# Patient Record
Sex: Male | Born: 1945 | Race: White | Hispanic: No | Marital: Single | Smoking: Never smoker
Health system: Southern US, Community
[De-identification: ages and names within clinical notes are randomized; demographics above are authoritative.]

## PROBLEM LIST (undated history)

## (undated) DIAGNOSIS — I10 Essential (primary) hypertension: Secondary | ICD-10-CM

## (undated) DIAGNOSIS — H409 Unspecified glaucoma: Secondary | ICD-10-CM

## (undated) DIAGNOSIS — E785 Hyperlipidemia, unspecified: Secondary | ICD-10-CM

## (undated) HISTORY — DX: Essential (primary) hypertension: I10

## (undated) HISTORY — PX: OTHER SURGICAL HISTORY: SHX169

## (undated) HISTORY — DX: Unspecified glaucoma: H40.9

## (undated) HISTORY — DX: Morbid (severe) obesity due to excess calories: E66.01

## (undated) HISTORY — DX: Hyperlipidemia, unspecified: E78.5

---

## 2001-02-06 ENCOUNTER — Encounter: Payer: Self-pay | Admitting: Emergency Medicine

## 2001-02-06 ENCOUNTER — Emergency Department (HOSPITAL_COMMUNITY): Admission: EM | Admit: 2001-02-06 | Discharge: 2001-02-06 | Payer: Self-pay | Admitting: Emergency Medicine

## 2008-01-22 ENCOUNTER — Ambulatory Visit: Payer: Self-pay | Admitting: Internal Medicine

## 2008-01-22 DIAGNOSIS — I10 Essential (primary) hypertension: Secondary | ICD-10-CM

## 2008-01-22 DIAGNOSIS — N508 Other specified disorders of male genital organs: Secondary | ICD-10-CM

## 2008-01-22 DIAGNOSIS — L989 Disorder of the skin and subcutaneous tissue, unspecified: Secondary | ICD-10-CM | POA: Insufficient documentation

## 2008-01-22 DIAGNOSIS — E669 Obesity, unspecified: Secondary | ICD-10-CM

## 2008-01-28 ENCOUNTER — Ambulatory Visit: Payer: Self-pay | Admitting: Internal Medicine

## 2008-01-28 LAB — CONVERTED CEMR LAB
ALT: 24 units/L (ref 0–53)
AST: 21 units/L (ref 0–37)
Albumin: 4 g/dL (ref 3.5–5.2)
Alkaline Phosphatase: 72 units/L (ref 39–117)
BUN: 15 mg/dL (ref 6–23)
Basophils Absolute: 0 10*3/uL (ref 0.0–0.1)
Basophils Relative: 0.4 % (ref 0.0–1.0)
Bilirubin, Direct: 0.2 mg/dL (ref 0.0–0.3)
CO2: 28 meq/L (ref 19–32)
Calcium: 9.5 mg/dL (ref 8.4–10.5)
Chloride: 105 meq/L (ref 96–112)
Cholesterol: 244 mg/dL (ref 0–200)
Creatinine, Ser: 1.1 mg/dL (ref 0.4–1.5)
Direct LDL: 181.1 mg/dL
Eosinophils Absolute: 0.1 10*3/uL (ref 0.0–0.6)
Eosinophils Relative: 2 % (ref 0.0–5.0)
GFR calc Af Amer: 88 mL/min
GFR calc non Af Amer: 73 mL/min
Glucose, Bld: 103 mg/dL — ABNORMAL HIGH (ref 70–99)
HCT: 41.6 % (ref 39.0–52.0)
HDL: 43.7 mg/dL (ref >39.0)
Hemoglobin: 14.3 g/dL (ref 13.0–17.0)
Hgb A1c MFr Bld: 5.2 % (ref 4.6–6.0)
Lymphocytes Relative: 21.7 % (ref 12.0–46.0)
MCHC: 34.3 g/dL (ref 30.0–36.0)
MCV: 92.3 fL (ref 78.0–100.0)
Monocytes Absolute: 0.6 10*3/uL (ref 0.2–0.7)
Monocytes Relative: 9.7 % (ref 3.0–11.0)
Neutro Abs: 4.1 10*3/uL (ref 1.4–7.7)
Neutrophils Relative %: 66.2 % (ref 43.0–77.0)
PSA: 2.45 ng/mL (ref 0.10–4.00)
Platelets: 375 10*3/uL (ref 150–400)
Potassium: 4.3 meq/L (ref 3.5–5.1)
RBC: 4.51 M/uL (ref 4.22–5.81)
RDW: 12.6 % (ref 11.5–14.6)
Sodium: 140 meq/L (ref 135–145)
TSH: 1.59 microintl units/mL (ref 0.35–5.50)
Total Bilirubin: 1.2 mg/dL (ref 0.3–1.2)
Total CHOL/HDL Ratio: 5.6
Total Protein: 6.7 g/dL (ref 6.0–8.3)
Triglycerides: 114 mg/dL (ref 0–149)
VLDL: 23 mg/dL (ref 0–40)
WBC: 6.1 10*3/uL (ref 4.5–10.5)

## 2008-02-01 ENCOUNTER — Telehealth: Payer: Self-pay | Admitting: Internal Medicine

## 2008-02-04 ENCOUNTER — Ambulatory Visit: Payer: Self-pay | Admitting: Internal Medicine

## 2008-02-24 ENCOUNTER — Ambulatory Visit: Payer: Self-pay | Admitting: Internal Medicine

## 2008-02-24 DIAGNOSIS — E785 Hyperlipidemia, unspecified: Secondary | ICD-10-CM | POA: Insufficient documentation

## 2008-03-24 ENCOUNTER — Encounter: Payer: Self-pay | Admitting: Internal Medicine

## 2008-04-19 ENCOUNTER — Ambulatory Visit: Payer: Self-pay | Admitting: Internal Medicine

## 2008-04-19 LAB — CONVERTED CEMR LAB
ALT: 24 units/L (ref 0–53)
AST: 25 units/L (ref 0–37)
BUN: 20 mg/dL (ref 6–23)
CO2: 28 meq/L (ref 19–32)
Calcium: 9.5 mg/dL (ref 8.4–10.5)
Chloride: 109 meq/L (ref 96–112)
Cholesterol: 186 mg/dL (ref 0–200)
Creatinine, Ser: 1.2 mg/dL (ref 0.4–1.5)
GFR calc Af Amer: 80 mL/min
GFR calc non Af Amer: 66 mL/min
Glucose, Bld: 103 mg/dL — ABNORMAL HIGH (ref 70–99)
HDL: 46.6 mg/dL (ref >39.0)
LDL Cholesterol: 118 mg/dL — ABNORMAL HIGH (ref 0–99)
Potassium: 4.4 meq/L (ref 3.5–5.1)
Sodium: 141 meq/L (ref 135–145)
Total CHOL/HDL Ratio: 4
Triglycerides: 109 mg/dL (ref 0–149)
VLDL: 22 mg/dL (ref 0–40)

## 2008-04-27 ENCOUNTER — Telehealth: Payer: Self-pay | Admitting: Internal Medicine

## 2008-05-04 ENCOUNTER — Ambulatory Visit: Payer: Self-pay | Admitting: Gastroenterology

## 2008-05-18 ENCOUNTER — Ambulatory Visit: Payer: Self-pay | Admitting: Gastroenterology

## 2008-06-16 ENCOUNTER — Encounter: Payer: Self-pay | Admitting: Internal Medicine

## 2008-10-21 ENCOUNTER — Encounter: Payer: Self-pay | Admitting: Internal Medicine

## 2009-05-10 ENCOUNTER — Ambulatory Visit: Payer: Self-pay | Admitting: Interventional Radiology

## 2009-05-10 ENCOUNTER — Ambulatory Visit: Payer: Self-pay | Admitting: Internal Medicine

## 2009-05-10 ENCOUNTER — Ambulatory Visit (HOSPITAL_BASED_OUTPATIENT_CLINIC_OR_DEPARTMENT_OTHER): Admission: RE | Admit: 2009-05-10 | Discharge: 2009-05-10 | Payer: Self-pay | Admitting: Internal Medicine

## 2009-05-10 DIAGNOSIS — R05 Cough: Secondary | ICD-10-CM

## 2011-10-07 ENCOUNTER — Ambulatory Visit (HOSPITAL_COMMUNITY)
Admission: RE | Admit: 2011-10-07 | Discharge: 2011-10-07 | Disposition: A | Discharge status: Home / Self Care | Payer: Self-pay | Source: Ambulatory Visit | Attending: Urology | Admitting: Urology

## 2011-10-07 ENCOUNTER — Ambulatory Visit (HOSPITAL_COMMUNITY): Payer: Self-pay

## 2011-10-07 ENCOUNTER — Ambulatory Visit
Admission: RE | Admit: 2011-10-07 | Discharge: 2011-10-07 | Disposition: A | Discharge status: Home / Self Care | Payer: Self-pay | Source: Ambulatory Visit | Attending: Family Medicine | Admitting: Family Medicine

## 2011-10-07 ENCOUNTER — Other Ambulatory Visit: Payer: Self-pay | Admitting: Family Medicine

## 2011-10-07 ENCOUNTER — Ambulatory Visit
Admission: RE | Admit: 2011-10-07 | Discharge: 2011-10-07 | Disposition: A | Discharge status: Home / Self Care | Payer: No Typology Code available for payment source | Source: Ambulatory Visit | Attending: Family Medicine | Admitting: Family Medicine

## 2011-10-07 ENCOUNTER — Other Ambulatory Visit: Payer: Self-pay | Admitting: Urology

## 2011-10-07 DIAGNOSIS — N50819 Testicular pain, unspecified: Secondary | ICD-10-CM

## 2011-10-07 DIAGNOSIS — E7800 Pure hypercholesterolemia, unspecified: Secondary | ICD-10-CM | POA: Insufficient documentation

## 2011-10-07 DIAGNOSIS — N4340 Spermatocele of epididymis, unspecified: Secondary | ICD-10-CM | POA: Insufficient documentation

## 2011-10-07 DIAGNOSIS — N4400 Torsion of testis, unspecified: Secondary | ICD-10-CM | POA: Insufficient documentation

## 2011-10-07 DIAGNOSIS — I10 Essential (primary) hypertension: Secondary | ICD-10-CM | POA: Insufficient documentation

## 2011-10-07 LAB — BASIC METABOLIC PANEL
Calcium: 9.7 mg/dL (ref 8.4–10.5)
GFR calc non Af Amer: 75 mL/min — ABNORMAL LOW (ref >90)
Sodium: 134 mEq/L — ABNORMAL LOW (ref 135–145)

## 2011-10-07 LAB — SURGICAL PCR SCREEN
MRSA, PCR: NEGATIVE
Staphylococcus aureus: POSITIVE — AB

## 2011-10-10 NOTE — Op Note (Signed)
Michael [name], Michael [name] NO.:  [id number]  MEDICAL RECORD NO.:  [id number]  LOCATION:  DAYL                         FACILITY:  [facility]  PHYSICIAN:  Excell Seltzer. Annabell Howells, M.D.    DATE OF BIRTH:  1946-09-04  DATE OF PROCEDURE:  10/07/2011 DATE OF DISCHARGE:                              OPERATIVE REPORT   PROCEDURE:  Right orchiectomy and left orchidopexy.  PREOPERATIVE DIAGNOSIS:  Right testicular torsion.  POSTOPERATIVE DIAGNOSIS:  Right testicular torsion.  SURGEON:  Excell Seltzer. Annabell Howells, M.D.  ANESTHESIA:  General.  SPECIMEN:  Right testicle.  DRAINS:  0.25 inch Penrose drain.  BLOOD LOSS:  20 cc.  COMPLICATIONS:  None.  INDICATIONS:  Mr. Michael [name] is a 65 year old white male who was seen in the [facility] this morning for a 3-day history of right testicular pain.  He was started on antibiotics and sent for a scrotal ultrasound which demonstrated no blood flow to the right testicle with a probable torsion with a reactive hydrocele.  On exam in the office, he had been indurated tender right hemiscrotum with diffuse penoscrotal erythema and edema, then it was felt that a scrotal exploration was indicated with probable outcome being right orchiectomy and left orchidopexy.  The risks were reviewed as well as the likelihood of achieving his surgical goals which I felt were good.  FINDINGS OF PROCEDURE:  The patient was given 1 g of Ancef.  He was taken to the operating room where general anesthetic was induced.  He was fitted with PAS hose.  His genitalia were clipped and he was prepped with Betadine solution.  An oblique incision was made along the skin lines all over the right hemiscrotum.  The subcutaneous tissue and dartos were quite edematous and somewhat indurated.  The tunica vaginalis was opened and there was abrupt spurt of fluid from his reactive hydrocele.  The fluid was not purulent or malodorous, but amber in color.  Once the fluid had been drained  from the tunica vaginalis, inspection revealed a nonviable testicle with what appeared to be an epididymal cyst full of clotted blood.  The testicle was twisted within the tunica vaginalis.  At this point, the pedicle was divided into 2 packets and clamped, then divided.  Each of the 2 packets were oversewn with a 2-0 Vicryl suture and a 0 Vicryl tie.  Once the cord had been managed, the wound was inspected.  There was some additional oozing from the dartos which was controlled with the Bovie. A sponge was packed in the right hemiscrotum at this time.  I then made a left anterior oblique scrotal incision and with a knife this was carried down through the very edematous subcutaneous tissues and dartos to the tunica vaginalis which was opened.  The testicle was inspected and was felt to be very healthy; however, there was a large spermatocele involving the bulk of the epididymis; however, I felt that it would not be prudent to try to manage that at this time.  3-0 silk sutures were then used to pex the testicular capsule to the dartos in a triangular fashion to avoid future torsion.  The dartos was then closed using  a running 3-0 chromic.  The skin was closed with interrupted vertical mattress 3-0 chromic.  The packing was then removed from the right hemiscrotum.  Inspection revealed no active bleeding.  The wound was then closed with interrupted full-thickness vertical mattress sutures and a 0.25 inch Penrose drain was placed within the hydrocele cavity exiting the lateral aspect of the wound.  Great care was taken to avoid entrapment of the drain with the strip stitch.  Once the wound had been closed down to the drain site, the drain was secured with 3-0 chromic suture and trimmed.  The foreskin which had been markedly edematous was then reduced. Additional Betadine was applied to further prep the glans and a red rubber catheter was used to drain the bladder.  Approximately 200  cc of very concentrated urine were returned.  The scrotum was then cleansed and dried and a dressing of 4x4 and Kerlix was applied.  The patient was taken down from lithotomy position.  His anesthetic was reversed.  He was moved to recovery room in a stable condition.  There were no complications.  He will follow with me on Wednesday for drain removal and then again on the 22nd for a wound check.  He will be sent home with a prescription for Vicodin.     Excell Seltzer. Annabell Howells, M.D.     JJW/MEDQ  D:  10/07/2011  T:  10/08/2011  Job:  [id number]  Electronically Signed by Bjorn Pippin M.D. on 10/10/2011 10:49:55 AM

## 2011-10-16 ENCOUNTER — Ambulatory Visit: Payer: Self-pay | Admitting: Internal Medicine

## 2014-08-02 ENCOUNTER — Encounter: Payer: Self-pay | Admitting: Gastroenterology

## 2015-12-04 ENCOUNTER — Encounter: Payer: Self-pay | Admitting: Internal Medicine

## 2016-03-29 ENCOUNTER — Telehealth: Payer: Self-pay | Admitting: Internal Medicine

## 2016-03-29 NOTE — Telephone Encounter (Signed)
Pt would like Rx for Tdap   Pharm [facility] on [address]

## 2016-03-29 NOTE — Telephone Encounter (Signed)
Patient hasn't had an office visit 05/10/09.  Okay to send Rx?

## 2016-04-01 NOTE — Telephone Encounter (Signed)
I am sorry but he will need to establish with new provider

## 2016-04-01 NOTE — Telephone Encounter (Signed)
Left detailed message on machine for patient to call and schedule an appointment to establish with a new provider

## 2016-04-10 ENCOUNTER — Ambulatory Visit: Payer: Self-pay | Admitting: Adult Health

## 2016-04-25 ENCOUNTER — Ambulatory Visit (INDEPENDENT_AMBULATORY_CARE_PROVIDER_SITE_OTHER): Payer: Medicare Other | Admitting: Adult Health

## 2016-04-25 ENCOUNTER — Encounter: Payer: Self-pay | Admitting: Adult Health

## 2016-04-25 VITALS — BP 162/100 | Temp 98.1°F | Ht 71.6 in | Wt 312.8 lb

## 2016-04-25 DIAGNOSIS — Z7689 Persons encountering health services in other specified circumstances: Secondary | ICD-10-CM

## 2016-04-25 DIAGNOSIS — I10 Essential (primary) hypertension: Secondary | ICD-10-CM | POA: Diagnosis not present

## 2016-04-25 DIAGNOSIS — R351 Nocturia: Secondary | ICD-10-CM | POA: Diagnosis not present

## 2016-04-25 DIAGNOSIS — E669 Obesity, unspecified: Secondary | ICD-10-CM | POA: Diagnosis not present

## 2016-04-25 DIAGNOSIS — N400 Enlarged prostate without lower urinary tract symptoms: Secondary | ICD-10-CM | POA: Diagnosis not present

## 2016-04-25 LAB — POC URINALSYSI DIPSTICK (AUTOMATED)
Bilirubin, UA: NEGATIVE
Glucose, UA: NEGATIVE
KETONES UA: NEGATIVE
LEUKOCYTES UA: NEGATIVE
Nitrite, UA: NEGATIVE
PH UA: 5.5
Protein, UA: NEGATIVE
RBC UA: NEGATIVE
Spec Grav, UA: 1.025
UROBILINOGEN UA: 0.2

## 2016-04-25 LAB — BASIC METABOLIC PANEL
BUN: 19 mg/dL (ref 6–23)
CALCIUM: 9.7 mg/dL (ref 8.4–10.5)
CO2: 25 mEq/L (ref 19–32)
CREATININE: 1.07 mg/dL (ref 0.40–1.50)
Chloride: 103 mEq/L (ref 96–112)
GFR: 73.19 mL/min (ref >60.00)
GLUCOSE: 89 mg/dL (ref 70–99)
Potassium: 4.5 mEq/L (ref 3.5–5.1)
Sodium: 139 mEq/L (ref 135–145)

## 2016-04-25 LAB — PSA: PSA: 3.98 ng/mL (ref 0.10–4.00)

## 2016-04-25 MED ORDER — TAMSULOSIN HCL 0.4 MG PO CAPS: 0.4000 mg | ORAL_CAPSULE | Freq: Every day | ORAL | Status: DC

## 2016-04-25 MED ORDER — LISINOPRIL 20 MG PO TABS: 20.0000 mg | ORAL_TABLET | Freq: Every day | ORAL | Status: DC

## 2016-04-25 NOTE — Progress Notes (Signed)
Patient presents to clinic today to establish care. He is a pleasant caucasian male who has not been seen by a PCP in many years. He  has a past medical history of Hyperlipidemia; High blood pressure; and Glaucoma.   Acute Concerns: Establish Care  Urinary Frequency .  - last six months he has had nocturia. He is also complaining of urinary incontinence. Reports " it feels like a sudden urge." he also feels as though he is not emptying his bladder completely    Chronic Issues: Hypertension  - Is not on any blood pressure medication currently. Has been in the past but does not know what he took. BP today BP: (!) 162/100 mmHg  Hyperlipidemia - Is not on any medication for hyperlipidemia. Has not taken any in the past.    Glaucoma - Was diagnosed with glaucoma 4 months ago.   Health Maintenance: Dental --Does not have a dentist  Vision -- [place]  Immunizations -- PCV 13  Colonoscopy -- 2009 - 10 year plan   Past Medical History  Diagnosis Date  . Hyperlipidemia   . High blood pressure   . Glaucoma     Past Surgical History  Procedure Laterality Date  . Cataract surgery      No current outpatient prescriptions on file prior to visit.   No current facility-administered medications on file prior to visit.    No Known Allergies  Family History  Problem Relation Age of Onset  . Cancer Mother     Larynx     Social History   Social History  . Marital Status: Single    Spouse Name: N/A  . Number of Children: N/A  . Years of Education: N/A   Occupational History  . Not on file.   Social History Main Topics  . Smoking status: Never Smoker   . Smokeless tobacco: Not on file  . Alcohol Use: 0.0 oz/week    0 Standard drinks or equivalent per week  . Drug Use: No  . Sexual Activity: Not on file   Other Topics Concern  . Not on file   Social History Narrative   Works for [employer]    Not married    Three children ( [place],  [place], [place])        Review of Systems  Constitutional: Negative.   HENT: Negative.   Respiratory: Negative.   Cardiovascular: Negative.   Gastrointestinal: Negative.   Genitourinary: Positive for urgency and frequency. Negative for dysuria, hematuria and flank pain.  Musculoskeletal: Negative.   Neurological: Negative.   All other systems reviewed and are negative.   BP 162/100 mmHg  Temp(Src) 98.1 F (36.7 C) (Oral)  Ht 5' 11.6" (1.819 m)  Wt 312 lb 12.8 oz (141.885 kg)  BMI 42.88 kg/m2  Physical Exam  Constitutional: He is oriented to person, place, and time and well-developed, well-nourished, and in no distress. No distress.  obese  HENT:  Head: Normocephalic and atraumatic.  Right Ear: External ear normal.  Left Ear: External ear normal.  Nose: Nose normal.  Mouth/Throat: Oropharynx is clear and moist. No oropharyngeal exudate.  Eyes: Conjunctivae and EOM are normal. Pupils are equal, round, and reactive to light. Right eye exhibits no discharge. Left eye exhibits no discharge.  Cardiovascular: Normal rate, regular rhythm, normal heart sounds and intact distal pulses.  Exam reveals no gallop and no friction rub.   No murmur heard. Pulmonary/Chest: Effort normal and breath sounds normal. No  respiratory distress. He has no wheezes. He has no rales. He exhibits no tenderness.  Genitourinary: Rectal exam shows no external hemorrhoid and no internal hemorrhoid. Guaiac negative stool. Prostate is enlarged. Prostate is not tender.  Musculoskeletal: Normal range of motion. He exhibits no edema or tenderness.  Neurological: He is alert and oriented to person, place, and time. Gait normal. GCS score is 15.  Skin: Skin is warm and dry. No rash noted. He is not diaphoretic. No erythema. No pallor.  Psychiatric: Mood, memory, affect and judgment normal.  Nursing note and vitals reviewed.   No results found for this or any previous visit (from the past 2160  hour(s)).  Assessment/Plan:   1. Encounter to establish care - Follow up for CPE - Follow up sooner if needed - He needs to start exercising and losing weight   2. Essential hypertension  - POCT Urinalysis Dipstick (Automated) - Basic metabolic panel - lisinopril (PRINIVIL,ZESTRIL) 20 MG tablet; Take 1 tablet (20 mg total) by mouth daily.  Dispense: 30 tablet; Refill: 3 - Follow up in 2 weeks for recheck  3. OBESITY - Diet and exercise - Consider Belviq  4. Enlarged prostate - tamsulosin (FLOMAX) 0.4 MG CAPS capsule; Take 1 capsule (0.4 mg total) by mouth daily.  Dispense: 30 capsule; Refill: 3 - POCT Urinalysis Dipstick (Automated) - PSA  5. Nocturia  - POCT Urinalysis Dipstick (Automated) - PSA - tamsulosin (FLOMAX) 0.4 MG CAPS capsule; Take 1 capsule (0.4 mg total) by mouth daily.  Dispense: 30 capsule; Refill: 3   Dorothyann Peng, NP

## 2016-04-25 NOTE — Patient Instructions (Addendum)
It was great meeting you today.   Please make a follow up appointment for two weeks regarding blood pressure.   Also make an appointment for your physical before July.   I will call you about the labs   Let me know if you need anything.

## 2016-05-09 ENCOUNTER — Encounter: Payer: Self-pay | Admitting: Adult Health

## 2016-05-09 ENCOUNTER — Ambulatory Visit (INDEPENDENT_AMBULATORY_CARE_PROVIDER_SITE_OTHER): Payer: Medicare Other | Admitting: Adult Health

## 2016-05-09 VITALS — BP 128/84 | Temp 98.4°F | Ht 71.6 in | Wt 316.2 lb

## 2016-05-09 DIAGNOSIS — I10 Essential (primary) hypertension: Secondary | ICD-10-CM

## 2016-05-09 DIAGNOSIS — Z23 Encounter for immunization: Secondary | ICD-10-CM

## 2016-05-09 NOTE — Progress Notes (Signed)
Subjective:    Patient ID: Michael [name], male    DOB: [birth year], 70 y.o.   MRN: [id number]  HPI   70 year old male who comes back to the office for two week follow up regarding blood pressure. When I last saw him I started him on Lisinopril 20 mg daily for a BP of 162/100.   He has been taking it every day and has not had any side effects.   His blood pressure is now well controlled.   BP Readings from Last 3 Encounters:  05/09/16 128/84  04/25/16 162/100  05/10/09 108/74    Review of Systems  Constitutional: Negative.   Respiratory: Negative.   Cardiovascular: Negative.   Neurological: Negative.   All other systems reviewed and are negative.  Past Medical History  Diagnosis Date  . Hyperlipidemia   . High blood pressure   . Glaucoma     Social History   Social History  . Marital Status: Single    Spouse Name: N/A  . Number of Children: N/A  . Years of Education: N/A   Occupational History  . Not on file.   Social History Main Topics  . Smoking status: Never Smoker   . Smokeless tobacco: Not on file  . Alcohol Use: 0.0 oz/week    0 Standard drinks or equivalent per week  . Drug Use: No  . Sexual Activity: Not on file   Other Topics Concern  . Not on file   Social History Narrative   Works for [employer]    Not married    Three children [place], [place], [place])        Past Surgical History  Procedure Laterality Date  . Cataract surgery      Family History  Problem Relation Age of Onset  . Cancer Mother     Larynx     No Known Allergies  Current Outpatient Prescriptions on File Prior to Visit  Medication Sig Dispense Refill  . brimonidine (ALPHAGAN) 0.2 % ophthalmic solution Place 1 drop into both eyes 3 (three) times daily.    . dorzolamide (TRUSOPT) 2 % ophthalmic solution Place 1 drop into both eyes 3 (three) times daily.    Marland Kitchen lisinopril (PRINIVIL,ZESTRIL) 20 MG tablet Take 1 tablet (20 mg total) by mouth daily. 30 tablet 3   . tamsulosin (FLOMAX) 0.4 MG CAPS capsule Take 1 capsule (0.4 mg total) by mouth daily. 30 capsule 3   No current facility-administered medications on file prior to visit.    BP 128/84 mmHg  Temp(Src) 98.4 F (36.9 C) (Oral)  Ht 5' 11.6" (1.819 m)  Wt 316 lb 3.2 oz (143.427 kg)  BMI 43.35 kg/m2       Objective:   Physical Exam  Constitutional: He is oriented to person, place, and time. He appears well-developed and well-nourished. No distress.  Cardiovascular: Normal rate, regular rhythm, normal heart sounds and intact distal pulses.  Exam reveals no gallop.   No murmur heard. Pulmonary/Chest: Effort normal and breath sounds normal. No respiratory distress. He has no wheezes. He has no rales. He exhibits no tenderness.  Neurological: He is alert and oriented to person, place, and time.  Skin: Skin is warm and dry. No rash noted. He is not diaphoretic. No erythema. No pallor.  Psychiatric: He has a normal mood and affect. His behavior is normal. Judgment and thought content normal.  Vitals reviewed.     Assessment & Plan:  1. Essential hypertension - Continue  with Lisinopril 20 mg - Work on diet and exercise - Monitor BP at home - Return precautions given   2. Need for prophylactic vaccination against Streptococcus pneumoniae (pneumococcus) - Pneumococcal conjugate vaccine 13-valent  Dorothyann Peng, NP

## 2016-05-09 NOTE — Patient Instructions (Signed)
It was great seeing you again!  Continue to monitor your BP and let me know if it is above 140/90 consistently  Work on diet and exercise  I will see you in a few weeks.

## 2016-05-16 ENCOUNTER — Other Ambulatory Visit (INDEPENDENT_AMBULATORY_CARE_PROVIDER_SITE_OTHER): Payer: Medicare Other

## 2016-05-16 DIAGNOSIS — Z0000 Encounter for general adult medical examination without abnormal findings: Secondary | ICD-10-CM | POA: Diagnosis not present

## 2016-05-16 DIAGNOSIS — Z125 Encounter for screening for malignant neoplasm of prostate: Secondary | ICD-10-CM

## 2016-05-16 LAB — POC URINALSYSI DIPSTICK (AUTOMATED)
BILIRUBIN UA: NEGATIVE
Blood, UA: NEGATIVE
GLUCOSE UA: NEGATIVE
KETONES UA: NEGATIVE
LEUKOCYTES UA: NEGATIVE
Nitrite, UA: NEGATIVE
Protein, UA: NEGATIVE
SPEC GRAV UA: 1.02
Urobilinogen, UA: 0.2
pH, UA: 6

## 2016-05-16 LAB — CBC WITH DIFFERENTIAL/PLATELET
BASOS ABS: 0.1 10*3/uL (ref 0.0–0.1)
Basophils Relative: 0.7 % (ref 0.0–3.0)
EOS ABS: 0.1 10*3/uL (ref 0.0–0.7)
Eosinophils Relative: 0.8 % (ref 0.0–5.0)
HEMATOCRIT: 41.8 % (ref 39.0–52.0)
HEMOGLOBIN: 14.1 g/dL (ref 13.0–17.0)
LYMPHS PCT: 22.1 % (ref 12.0–46.0)
Lymphs Abs: 1.7 10*3/uL (ref 0.7–4.0)
MCHC: 33.7 g/dL (ref 30.0–36.0)
MCV: 91.8 fl (ref 78.0–100.0)
MONO ABS: 0.6 10*3/uL (ref 0.1–1.0)
Monocytes Relative: 8.3 % (ref 3.0–12.0)
NEUTROS ABS: 5.2 10*3/uL (ref 1.4–7.7)
Neutrophils Relative %: 68.1 % (ref 43.0–77.0)
PLATELETS: 378 10*3/uL (ref 150.0–400.0)
RBC: 4.55 Mil/uL (ref 4.22–5.81)
RDW: 14 % (ref 11.5–15.5)
WBC: 7.7 10*3/uL (ref 4.0–10.5)

## 2016-05-16 LAB — HEPATIC FUNCTION PANEL
ALK PHOS: 81 U/L (ref 39–117)
ALT: 13 U/L (ref 0–53)
AST: 13 U/L (ref 0–37)
Albumin: 3.9 g/dL (ref 3.5–5.2)
BILIRUBIN DIRECT: 0.1 mg/dL (ref 0.0–0.3)
BILIRUBIN TOTAL: 0.7 mg/dL (ref 0.2–1.2)
TOTAL PROTEIN: 6.1 g/dL (ref 6.0–8.3)

## 2016-05-16 LAB — BASIC METABOLIC PANEL
BUN: 17 mg/dL (ref 6–23)
CALCIUM: 9.1 mg/dL (ref 8.4–10.5)
CHLORIDE: 106 meq/L (ref 96–112)
CO2: 28 meq/L (ref 19–32)
CREATININE: 1.05 mg/dL (ref 0.40–1.50)
GFR: 74.79 mL/min (ref >60.00)
GLUCOSE: 87 mg/dL (ref 70–99)
Potassium: 4.5 mEq/L (ref 3.5–5.1)
Sodium: 139 mEq/L (ref 135–145)

## 2016-05-16 LAB — LIPID PANEL
CHOL/HDL RATIO: 6
Cholesterol: 237 mg/dL — ABNORMAL HIGH (ref 0–200)
HDL: 42.1 mg/dL (ref >39.00)
LDL CALC: 166 mg/dL — AB (ref 0–99)
NONHDL: 194.61
TRIGLYCERIDES: 142 mg/dL (ref 0.0–149.0)
VLDL: 28.4 mg/dL (ref 0.0–40.0)

## 2016-05-16 LAB — TSH: TSH: 2.35 u[IU]/mL (ref 0.35–4.50)

## 2016-05-16 LAB — PSA: PSA: 3.76 ng/mL (ref 0.10–4.00)

## 2016-05-17 ENCOUNTER — Other Ambulatory Visit: Payer: Medicare Other

## 2016-05-23 ENCOUNTER — Ambulatory Visit (INDEPENDENT_AMBULATORY_CARE_PROVIDER_SITE_OTHER): Payer: Medicare Other | Admitting: Adult Health

## 2016-05-23 ENCOUNTER — Encounter: Payer: Self-pay | Admitting: Adult Health

## 2016-05-23 VITALS — BP 150/82 | Temp 98.2°F | Ht 71.6 in | Wt 313.5 lb

## 2016-05-23 DIAGNOSIS — E669 Obesity, unspecified: Secondary | ICD-10-CM

## 2016-05-23 DIAGNOSIS — E785 Hyperlipidemia, unspecified: Secondary | ICD-10-CM

## 2016-05-23 DIAGNOSIS — I10 Essential (primary) hypertension: Secondary | ICD-10-CM

## 2016-05-23 MED ORDER — LISINOPRIL 30 MG PO TABS: 30.0000 mg | ORAL_TABLET | Freq: Every day | ORAL | Status: DC

## 2016-05-23 MED ORDER — ATORVASTATIN CALCIUM 20 MG PO TABS: 20.0000 mg | ORAL_TABLET | Freq: Every day | ORAL | Status: DC

## 2016-05-23 NOTE — Patient Instructions (Addendum)
It was great seeing you again today.   I have sent in a prescription for Lipitor, take this daily. Please let me know if you have any side effects. We will retest your cholesterol in 6 months.   I have gone up to 30 mg of Lisinopril. Please start monitoring Blood pressure at home and send me readings in the next 2 weeks.   Work on weight reduction through diet and exercise.   Follow up with me in 6 months.   I would also like for you to sign up for an annual wellness visit on a Friday with our nurse Manuela Schwartz. This is a free benefit under medicare that may help [place] find additional ways to help you.       Health Maintenance, Male A healthy lifestyle and preventative care can promote health and wellness.  Maintain regular health, dental, and eye exams.  Eat a healthy diet. Foods like vegetables, fruits, whole grains, low-fat dairy products, and lean protein foods contain the nutrients you need and are low in calories. Decrease your intake of foods high in solid fats, added sugars, and salt. Get information about a proper diet from your health care provider, if necessary.  Regular physical exercise is one of the most important things you can do for your health. Most adults should get at least 150 minutes of moderate-intensity exercise (any activity that increases your heart rate and causes you to sweat) each week. In addition, most adults need muscle-strengthening exercises on 2 or more days a week.   Maintain a healthy weight. The body mass index (BMI) is a screening tool to identify possible weight problems. It provides an estimate of body fat based on height and weight. Your health care provider can find your BMI and can help you achieve or maintain a healthy weight. For males 20 years and older:  A BMI below 18.5 is considered underweight.  A BMI of 18.5 to 24.9 is normal.  A BMI of 25 to 29.9 is considered overweight.  A BMI of 30 and above is considered obese.  Maintain normal blood  lipids and cholesterol by exercising and minimizing your intake of saturated fat. Eat a balanced diet with plenty of fruits and vegetables. Blood tests for lipids and cholesterol should begin at age 24 and be repeated every 5 years. If your lipid or cholesterol levels are high, you are over age 51, or you are at high risk for heart disease, you may need your cholesterol levels checked more frequently.Ongoing high lipid and cholesterol levels should be treated with medicines if diet and exercise are not working.  If you smoke, find out from your health care provider how to quit. If you do not use tobacco, do not start.  Lung cancer screening is recommended for adults aged 32-80 years who are at high risk for developing lung cancer because of a history of smoking. A yearly low-dose CT scan of the lungs is recommended for people who have at least a 30-pack-year history of smoking and are current smokers or have quit within the past 15 years. A pack year of smoking is smoking an average of 1 pack of cigarettes a day for 1 year (for example, a 30-pack-year history of smoking could mean smoking 1 pack a day for 30 years or 2 packs a day for 15 years). Yearly screening should continue until the smoker has stopped smoking for at least 15 years. Yearly screening should be stopped for people who develop a health problem  that would prevent them from having lung cancer treatment.  If you choose to drink alcohol, do not have more than 2 drinks per day. One drink is considered to be 12 oz (360 mL) of beer, 5 oz (150 mL) of wine, or 1.5 oz (45 mL) of liquor.  Avoid the use of street drugs. Do not share needles with anyone. Ask for help if you need support or instructions about stopping the use of drugs.  High blood pressure causes heart disease and increases the risk of stroke. High blood pressure is more likely to develop in:  People who have blood pressure in the end of the normal range (100-139/85-89 mm  Hg).  People who are overweight or obese.  People who are African American.  If you are 40-43 years of age, have your blood pressure checked every 3-5 years. If you are 38 years of age or older, have your blood pressure checked every year. You should have your blood pressure measured twice--once when you are at a hospital or clinic, and once when you are not at a hospital or clinic. Record the average of the two measurements. To check your blood pressure when you are not at a hospital or clinic, you can use:  An automated blood pressure machine at a pharmacy.  A home blood pressure monitor.  If you are 57-73 years old, ask your health care provider if you should take aspirin to prevent heart disease.  Diabetes screening involves taking a blood sample to check your fasting blood sugar level. This should be done once every 3 years after age 73 if you are at a normal weight and without risk factors for diabetes. Testing should be considered at a younger age or be carried out more frequently if you are overweight and have at least 1 risk factor for diabetes.  Colorectal cancer can be detected and often prevented. Most routine colorectal cancer screening begins at the age of 4 and continues through age 83. However, your health care provider may recommend screening at an earlier age if you have risk factors for colon cancer. On a yearly basis, your health care provider may provide home test kits to check for hidden blood in the stool. A small camera at the end of a tube may be used to directly examine the colon (sigmoidoscopy or colonoscopy) to detect the earliest forms of colorectal cancer. Talk to your health care provider about this at age 88 when routine screening begins. A direct exam of the colon should be repeated every 5-10 years through age 63, unless early forms of precancerous polyps or small growths are found.  People who are at an increased risk for hepatitis B should be screened for this  virus. You are considered at high risk for hepatitis B if:  You were born in a country where hepatitis B occurs often. Talk with your health care provider about which countries are considered high risk.  Your parents were born in a high-risk country and you have not received a shot to protect against hepatitis B (hepatitis B vaccine).  You have HIV or AIDS.  You use needles to inject street drugs.  You live with, or have sex with, someone who has hepatitis B.  You are a man who has sex with other men (MSM).  You get hemodialysis treatment.  You take certain medicines for conditions like cancer, organ transplantation, and autoimmune conditions.  Hepatitis C blood testing is recommended for all people born from [birth year] through 1963  and any individual with known risk factors for hepatitis C.  Healthy men should no longer receive prostate-specific antigen (PSA) blood tests as part of routine cancer screening. Talk to your health care provider about prostate cancer screening.  Testicular cancer screening is not recommended for adolescents or adult males who have no symptoms. Screening includes self-exam, a health care provider exam, and other screening tests. Consult with your health care provider about any symptoms you have or any concerns you have about testicular cancer.  Practice safe sex. Use condoms and avoid high-risk sexual practices to reduce the spread of sexually transmitted infections (STIs).  You should be screened for STIs, including gonorrhea and chlamydia if:  You are sexually active and are younger than 24 years.  You are older than 24 years, and your health care provider tells you that you are at risk for this type of infection.  Your sexual activity has changed since you were last screened, and you are at an increased risk for chlamydia or gonorrhea. Ask your health care provider if you are at risk.  If you are at risk of being infected with HIV, it is recommended that  you take a prescription medicine daily to prevent HIV infection. This is called pre-exposure prophylaxis (PrEP). You are considered at risk if:  You are a man who has sex with other men (MSM).  You are a heterosexual man who is sexually active with multiple partners.  You take drugs by injection.  You are sexually active with a partner who has HIV.  Talk with your health care provider about whether you are at high risk of being infected with HIV. If you choose to begin PrEP, you should first be tested for HIV. You should then be tested every 3 months for as long as you are taking PrEP.  Use sunscreen. Apply sunscreen liberally and repeatedly throughout the day. You should seek shade when your shadow is shorter than you. Protect yourself by wearing long sleeves, pants, a wide-brimmed hat, and sunglasses year round whenever you are outdoors.  Tell your health care provider of new moles or changes in moles, especially if there is a change in shape or color. Also, tell your health care provider if a mole is larger than the size of a pencil eraser.  A one-time screening for abdominal aortic aneurysm (AAA) and surgical repair of large AAAs by ultrasound is recommended for men aged 23-75 years who are current or former smokers.  Stay current with your vaccines (immunizations).   This information is not intended to replace advice given to you by your health care provider. Make sure you discuss any questions you have with your health care provider.   Document Released: 06/06/2008 Document Revised: 12/30/2014 Document Reviewed: 05/06/2011 Elsevier Interactive Patient Education [employer].

## 2016-05-23 NOTE — Progress Notes (Signed)
Subjective:    Patient ID: Michael [name], male    DOB: 1946-07-29, 70 y.o.   MRN: [id number]  HPI  70 year old male who  has a past medical history of Hyperlipidemia; High blood pressure; Glaucoma; and Morbid obesity ([place]). Presents to the office today for follow up regarding hypertension and hyperlipidemia.   During his last visit he his blood pressure was at 128/84, this was after we started him on 20 mg lisinopril. Today in the office his BP is 150/92. He reports that he took his medication about one hour a go.  He is not monitoring it at home.   His cholesterol is elevated today. He has been on a statin in the past and had no difficulty with it. He is unsure of why he stopped taking it.   He is up to date on his vaccination as well as colonoscopy and eye exams.   He denies any acute complaints.    Review of Systems  Constitutional: Negative.   HENT: Negative.   Eyes: Negative.   Respiratory: Negative.   Cardiovascular: Negative.   Gastrointestinal: Negative.   Endocrine: Negative.   Genitourinary: Negative.   Musculoskeletal: Negative.   Allergic/Immunologic: Negative.   Neurological: Negative.   Hematological: Negative.   Psychiatric/Behavioral: Negative.   All other systems reviewed and are negative.  Past Medical History  Diagnosis Date  . Hyperlipidemia   . High blood pressure   . Glaucoma   . Morbid obesity ([place])     Social History   Social History  . Marital Status: Single    Spouse Name: N/A  . Number of Children: N/A  . Years of Education: N/A   Occupational History  . Not on file.   Social History Main Topics  . Smoking status: Never Smoker   . Smokeless tobacco: Not on file  . Alcohol Use: 0.0 oz/week    0 Standard drinks or equivalent per week  . Drug Use: No  . Sexual Activity: Not on file   Other Topics Concern  . Not on file   Social History Narrative   Works for [employer]    Not married    Three children [place], [place],  [place])        Past Surgical History  Procedure Laterality Date  . Cataract surgery      Family History  Problem Relation Age of Onset  . Cancer Mother     Larynx     No Known Allergies  Current Outpatient Prescriptions on File Prior to Visit  Medication Sig Dispense Refill  . brimonidine (ALPHAGAN) 0.2 % ophthalmic solution Place 1 drop into both eyes 3 (three) times daily.    . dorzolamide (TRUSOPT) 2 % ophthalmic solution Place 1 drop into both eyes 3 (three) times daily.    . tamsulosin (FLOMAX) 0.4 MG CAPS capsule Take 1 capsule (0.4 mg total) by mouth daily. 30 capsule 3   No current facility-administered medications on file prior to visit.    BP 150/82 mmHg  Temp(Src) 98.2 F (36.8 C) (Oral)  Ht 5' 11.6" (1.819 m)  Wt 313 lb 8 oz (142.203 kg)  BMI 42.98 kg/m2       Objective:   Physical Exam  Constitutional: He is oriented to person, place, and time. He appears well-developed and well-nourished. No distress.  obese  HENT:  Right Ear: Hearing, tympanic membrane, external ear and ear canal normal.  Left Ear: Hearing, tympanic membrane, external ear and ear  canal normal.  Mouth/Throat: Uvula is midline and oropharynx is clear and moist. Dental caries present. No oropharyngeal exudate, posterior oropharyngeal edema, posterior oropharyngeal erythema or tonsillar abscesses.  Cardiovascular: Normal rate, regular rhythm, normal heart sounds and intact distal pulses.  Exam reveals no gallop.   No murmur heard. Pulmonary/Chest: Effort normal and breath sounds normal. No respiratory distress. He has no wheezes. He has no rales. He exhibits no tenderness.  Genitourinary: Rectum normal and penis normal. Rectal exam shows no external hemorrhoid, no internal hemorrhoid, no tenderness and anal tone normal. Guaiac negative stool. Prostate is enlarged. Prostate is not tender. No penile tenderness.  Musculoskeletal: Normal range of motion. He exhibits no edema or tenderness.    Neurological: He is alert and oriented to person, place, and time. He has normal reflexes.  Skin: Skin is warm and dry. No rash noted. He is not diaphoretic. No erythema. No pallor.  Psychiatric: He has a normal mood and affect. His behavior is normal. Judgment and thought content normal.  Nursing note and vitals reviewed.      Assessment & Plan:  1. Essential hypertension - Reviewed labs in detail with patient.  - Increased lisinopril from 20 mg to 30 mg.  - lisinopril (PRINIVIL,ZESTRIL) 30 MG tablet; Take 1 tablet (30 mg total) by mouth daily.  Dispense: 90 tablet; Refill: 3 - EKG 12-Lead - NSR, rate 66 - Get BP cuff and start monitoring at home - Send me results in the next two weeks - He needs to work on weight loss via diet and exercise.   2. Hyperlipidemia - atorvastatin (LIPITOR) 20 MG tablet; Take 1 tablet (20 mg total) by mouth daily.  Dispense: 90 tablet; Refill: 3 - EKG 12-Lead  3. Obesity - Needs to work on weight loss through diet and exercise - Consider phentermine.    Dorothyann Peng, NP

## 2016-07-03 DIAGNOSIS — H2513 Age-related nuclear cataract, bilateral: Secondary | ICD-10-CM | POA: Diagnosis not present

## 2016-07-11 DIAGNOSIS — H401123 Primary open-angle glaucoma, left eye, severe stage: Secondary | ICD-10-CM | POA: Diagnosis not present

## 2016-07-11 DIAGNOSIS — H2512 Age-related nuclear cataract, left eye: Secondary | ICD-10-CM | POA: Diagnosis not present

## 2016-08-22 ENCOUNTER — Other Ambulatory Visit: Payer: Self-pay | Admitting: Adult Health

## 2016-08-22 DIAGNOSIS — R351 Nocturia: Secondary | ICD-10-CM

## 2016-08-23 ENCOUNTER — Telehealth: Payer: Self-pay | Admitting: Adult Health

## 2016-08-23 NOTE — Telephone Encounter (Signed)
rx already called in

## 2016-09-24 ENCOUNTER — Other Ambulatory Visit: Payer: Self-pay | Admitting: Adult Health

## 2016-09-24 DIAGNOSIS — R351 Nocturia: Secondary | ICD-10-CM

## 2016-09-24 NOTE — Telephone Encounter (Signed)
Ok to refill for one year  

## 2016-09-24 NOTE — Telephone Encounter (Signed)
OK to refill

## 2016-10-18 DIAGNOSIS — H401123 Primary open-angle glaucoma, left eye, severe stage: Secondary | ICD-10-CM | POA: Diagnosis not present

## 2017-01-10 DIAGNOSIS — H401133 Primary open-angle glaucoma, bilateral, severe stage: Secondary | ICD-10-CM | POA: Diagnosis not present

## 2017-01-10 DIAGNOSIS — H401123 Primary open-angle glaucoma, left eye, severe stage: Secondary | ICD-10-CM | POA: Diagnosis not present

## 2017-01-10 DIAGNOSIS — H401113 Primary open-angle glaucoma, right eye, severe stage: Secondary | ICD-10-CM | POA: Diagnosis not present

## 2017-02-06 DIAGNOSIS — H401133 Primary open-angle glaucoma, bilateral, severe stage: Secondary | ICD-10-CM | POA: Diagnosis not present

## 2017-02-06 DIAGNOSIS — H401123 Primary open-angle glaucoma, left eye, severe stage: Secondary | ICD-10-CM | POA: Diagnosis not present

## 2017-03-06 DIAGNOSIS — H401123 Primary open-angle glaucoma, left eye, severe stage: Secondary | ICD-10-CM | POA: Diagnosis not present

## 2017-05-09 DIAGNOSIS — H401113 Primary open-angle glaucoma, right eye, severe stage: Secondary | ICD-10-CM | POA: Diagnosis not present

## 2017-05-16 ENCOUNTER — Ambulatory Visit (INDEPENDENT_AMBULATORY_CARE_PROVIDER_SITE_OTHER): Payer: Medicare HMO | Admitting: Adult Health

## 2017-05-16 VITALS — BP 148/70 | Temp 98.6°F | Ht 71.6 in | Wt 299.6 lb

## 2017-05-16 DIAGNOSIS — M25562 Pain in left knee: Secondary | ICD-10-CM | POA: Diagnosis not present

## 2017-05-16 DIAGNOSIS — I10 Essential (primary) hypertension: Secondary | ICD-10-CM | POA: Diagnosis not present

## 2017-05-16 DIAGNOSIS — M25561 Pain in right knee: Secondary | ICD-10-CM

## 2017-05-16 MED ORDER — LISINOPRIL 40 MG PO TABS: 40.0000 mg | ORAL_TABLET | Freq: Every day | ORAL | 1 refills | Status: DC

## 2017-05-16 NOTE — Progress Notes (Signed)
Subjective:    Patient ID: Michael [name], male    DOB: 1946/06/19, 71 y.o.   MRN: [id number]  HPI  71 year old male who  has a past medical history of Glaucoma; High blood pressure; Hyperlipidemia; and Morbid obesity ([place]). He presents to the office today for follow up regarding hypertension. I last saw him 6 months ago, at which time I increased his dose of lisinopril from 20 mg to 30 mg. He was asked to send me his readings after two weeks, which he never did.   He has not been monitoring his blood pressure at home on a regular basis.   He also reports that he is having worsening knee pain and would like to see orthopedics for consultation on knee replacements.   BP Readings from Last 3 Encounters:  05/16/17 (!) 148/70  05/23/16 (!) 150/82  05/09/16 128/84    Review of Systems See HPI   Past Medical History:  Diagnosis Date  . Glaucoma   . High blood pressure   . Hyperlipidemia   . Morbid obesity ([place])     Social History   Social History  . Marital status: Single    Spouse name: N/A  . Number of children: N/A  . Years of education: N/A   Occupational History  . Not on file.   Social History Main Topics  . Smoking status: Never Smoker  . Smokeless tobacco: Not on file  . Alcohol use 0.0 oz/week  . Drug use: No  . Sexual activity: Not on file   Other Topics Concern  . Not on file   Social History Narrative   Works for [employer]    Not married    Three children [place], [place], [place])        Past Surgical History:  Procedure Laterality Date  . cataract surgery      Family History  Problem Relation Age of Onset  . Cancer Mother        Larynx     No Known Allergies  Current Outpatient Prescriptions on File Prior to Visit  Medication Sig Dispense Refill  . atorvastatin (LIPITOR) 20 MG tablet Take 1 tablet (20 mg total) by mouth daily. 90 tablet 3  . brimonidine (ALPHAGAN) 0.2 % ophthalmic solution Place 1 drop into both eyes 3  (three) times daily.    . dorzolamide (TRUSOPT) 2 % ophthalmic solution Place 1 drop into both eyes 3 (three) times daily.    . tamsulosin (FLOMAX) 0.4 MG CAPS capsule TAKE 1 CAPSULE(0.4 MG) BY MOUTH DAILY 90 capsule 3   No current facility-administered medications on file prior to visit.     BP (!) 148/70 (BP Location: Left Arm, Patient Position: Sitting, Cuff Size: Normal)   Temp 98.6 F (37 C) (Oral)   Ht 5' 11.6" (1.819 m)   Wt 299 lb 9.6 oz (135.9 kg)   BMI 41.09 kg/m       Objective:   Physical Exam  Constitutional: He is oriented to person, place, and time. He appears well-developed and well-nourished. No distress.  Cardiovascular: Normal rate, regular rhythm, normal heart sounds and intact distal pulses.  Exam reveals no friction rub.   No murmur heard. Pulmonary/Chest: Effort normal and breath sounds normal. No respiratory distress. He has no wheezes. He has no rales. He exhibits no tenderness.  Musculoskeletal: He exhibits tenderness (bilateral knees). He exhibits no edema or deformity.  Neurological: He is alert and oriented to person, place, and time.  Skin: Skin is warm and dry. No rash noted. He is not diaphoretic. No erythema. No pallor.  Psychiatric: He has a normal mood and affect. His behavior is normal. Judgment and thought content normal.  Nursing note and vitals reviewed.      Assessment & Plan:  1. Essential hypertension - Not at goal. I am going to increase lisinopril from 30 mg to 40 mg and have him follow up up in two weeks for his physical.  - lisinopril (PRINIVIL,ZESTRIL) 40 MG tablet; Take 1 tablet (40 mg total) by mouth daily.  Dispense: 90 tablet; Refill: 1  2. Acute pain of both knees  - AMB referral to orthopedics  Dorothyann Peng, NP

## 2017-05-27 DIAGNOSIS — M1712 Unilateral primary osteoarthritis, left knee: Secondary | ICD-10-CM | POA: Diagnosis not present

## 2017-05-27 DIAGNOSIS — M170 Bilateral primary osteoarthritis of knee: Secondary | ICD-10-CM | POA: Diagnosis not present

## 2017-05-27 DIAGNOSIS — M1711 Unilateral primary osteoarthritis, right knee: Secondary | ICD-10-CM | POA: Diagnosis not present

## 2017-06-05 ENCOUNTER — Encounter: Payer: Self-pay | Admitting: Adult Health

## 2017-06-05 ENCOUNTER — Ambulatory Visit (INDEPENDENT_AMBULATORY_CARE_PROVIDER_SITE_OTHER): Payer: Medicare HMO | Admitting: Adult Health

## 2017-06-05 VITALS — BP 128/70 | Temp 98.2°F | Ht 71.6 in | Wt 296.2 lb

## 2017-06-05 DIAGNOSIS — Z6841 Body Mass Index (BMI) 40.0 and over, adult: Secondary | ICD-10-CM

## 2017-06-05 DIAGNOSIS — Z23 Encounter for immunization: Secondary | ICD-10-CM | POA: Diagnosis not present

## 2017-06-05 DIAGNOSIS — Z0000 Encounter for general adult medical examination without abnormal findings: Secondary | ICD-10-CM

## 2017-06-05 DIAGNOSIS — E782 Mixed hyperlipidemia: Secondary | ICD-10-CM | POA: Diagnosis not present

## 2017-06-05 DIAGNOSIS — I10 Essential (primary) hypertension: Secondary | ICD-10-CM

## 2017-06-05 DIAGNOSIS — Z1159 Encounter for screening for other viral diseases: Secondary | ICD-10-CM | POA: Diagnosis not present

## 2017-06-05 LAB — HEPATIC FUNCTION PANEL
ALBUMIN: 4 g/dL (ref 3.5–5.2)
ALK PHOS: 95 U/L (ref 39–117)
ALT: 15 U/L (ref 0–53)
AST: 12 U/L (ref 0–37)
BILIRUBIN DIRECT: 0.2 mg/dL (ref 0.0–0.3)
TOTAL PROTEIN: 6.3 g/dL (ref 6.0–8.3)
Total Bilirubin: 0.9 mg/dL (ref 0.2–1.2)

## 2017-06-05 LAB — LIPID PANEL
CHOL/HDL RATIO: 3
Cholesterol: 162 mg/dL (ref 0–200)
HDL: 46.8 mg/dL (ref >39.00)
LDL Cholesterol: 99 mg/dL (ref 0–99)
NONHDL: 115.34
Triglycerides: 82 mg/dL (ref 0.0–149.0)
VLDL: 16.4 mg/dL (ref 0.0–40.0)

## 2017-06-05 LAB — TSH: TSH: 3.01 u[IU]/mL (ref 0.35–4.50)

## 2017-06-05 LAB — CBC WITH DIFFERENTIAL/PLATELET
BASOS ABS: 0.1 10*3/uL (ref 0.0–0.1)
BASOS PCT: 1 % (ref 0.0–3.0)
EOS ABS: 0.1 10*3/uL (ref 0.0–0.7)
Eosinophils Relative: 0.7 % (ref 0.0–5.0)
HEMATOCRIT: 43.7 % (ref 39.0–52.0)
HEMOGLOBIN: 14.7 g/dL (ref 13.0–17.0)
LYMPHS PCT: 18.1 % (ref 12.0–46.0)
Lymphs Abs: 1.6 10*3/uL (ref 0.7–4.0)
MCHC: 33.7 g/dL (ref 30.0–36.0)
MCV: 91.8 fl (ref 78.0–100.0)
MONOS PCT: 9.3 % (ref 3.0–12.0)
Monocytes Absolute: 0.8 10*3/uL (ref 0.1–1.0)
NEUTROS ABS: 6.2 10*3/uL (ref 1.4–7.7)
Neutrophils Relative %: 70.9 % (ref 43.0–77.0)
PLATELETS: 389 10*3/uL (ref 150.0–400.0)
RBC: 4.75 Mil/uL (ref 4.22–5.81)
RDW: 13.9 % (ref 11.5–15.5)
WBC: 8.8 10*3/uL (ref 4.0–10.5)

## 2017-06-05 LAB — BASIC METABOLIC PANEL
BUN: 18 mg/dL (ref 6–23)
CHLORIDE: 104 meq/L (ref 96–112)
CO2: 28 meq/L (ref 19–32)
CREATININE: 1.09 mg/dL (ref 0.40–1.50)
Calcium: 9.6 mg/dL (ref 8.4–10.5)
GFR: 71.41 mL/min (ref >60.00)
Glucose, Bld: 96 mg/dL (ref 70–99)
Potassium: 4.6 mEq/L (ref 3.5–5.1)
SODIUM: 139 meq/L (ref 135–145)

## 2017-06-05 LAB — PSA: PSA: 5.88 ng/mL — ABNORMAL HIGH (ref 0.10–4.00)

## 2017-06-05 MED ORDER — ATORVASTATIN CALCIUM 20 MG PO TABS: 20.0000 mg | ORAL_TABLET | Freq: Every day | ORAL | 3 refills | Status: AC

## 2017-06-05 NOTE — Progress Notes (Signed)
Subjective:    Patient ID: Michael [name], male    DOB: [birth year], 71 y.o.   MRN: [id number]  HPI  Patient presents for yearly preventative medicine examination. He is a pleasant 71 year old male who  has a past medical history of Glaucoma; High blood pressure; Hyperlipidemia; and Morbid obesity ([place]).   All immunizations and health maintenance protocols were reviewed with the patient and needed orders were placed. He is due for Pneumovax 23.   Appropriate screening laboratory values were ordered for the patient including screening of hyperlipidemia, renal function and hepatic function. If indicated by BPH, a PSA was ordered.  Medication reconciliation,  past medical history, social history, problem list and allergies were reviewed in detail with the patient  Goals were established with regard to weight loss, exercise, and  diet in compliance with medications  He takes lisinopril 40 mg for hypertension management. Hyperlipidemia is controlled with lipitor 20 mg   He is do next year for his colonoscopy. He sees his eye doctor on a regular basis due to Glaucoma   He has been [facility] by [facility] for bilateral knee pain. He was given steroid injections into both knees. He has noticed marginal difference in the amount of pain. They would like him to lose 30 pounds before knee replacement  Review of Systems  Constitutional: Negative.   HENT: Negative.   Eyes: Negative.   Respiratory: Negative.   Cardiovascular: Negative.   Gastrointestinal: Negative.   Endocrine: Negative.   Genitourinary: Negative.   Musculoskeletal: Positive for arthralgias and gait problem.  Skin: Negative.   Allergic/Immunologic: Negative.   Hematological: Negative.   Psychiatric/Behavioral: Negative.   All other systems reviewed and are negative.  Past Medical History:  Diagnosis Date  . Glaucoma   . High blood pressure   . Hyperlipidemia   . Morbid obesity ([place])     Social History    Social History  . Marital status: Single    Spouse name: N/A  . Number of children: N/A  . Years of education: N/A   Occupational History  . Not on file.   Social History Main Topics  . Smoking status: Never Smoker  . Smokeless tobacco: Never Used  . Alcohol use 0.0 oz/week  . Drug use: No  . Sexual activity: Not on file   Other Topics Concern  . Not on file   Social History Narrative   Works for [employer]    Not married    Three children [place], [place], [place])        Past Surgical History:  Procedure Laterality Date  . cataract surgery      Family History  Problem Relation Age of Onset  . Cancer Mother        Larynx     No Known Allergies  Current Outpatient Prescriptions on File Prior to Visit  Medication Sig Dispense Refill  . brimonidine (ALPHAGAN) 0.2 % ophthalmic solution Place 1 drop into both eyes 3 (three) times daily.    . dorzolamide (TRUSOPT) 2 % ophthalmic solution Place 1 drop into both eyes 3 (three) times daily.    Mar[place] Kitchen lisinopril (PRINIVIL,ZESTRIL) 40 MG tablet Take 1 tablet (40 mg total) by mouth daily. 90 tablet 1  . tamsulosin (FLOMAX) 0.4 MG CAPS capsule TAKE 1 CAPSULE(0.4 MG) BY MOUTH DAILY 90 capsule 3   No current facility-administered medications on file prior to visit.     BP 128/70 (BP Location: Left Arm, Patient Position: Sitting,  Cuff Size: Normal)   Temp 98.2 F (36.8 C) (Oral)   Ht 5' 11.6" (1.819 m)   Wt 296 lb 3.2 oz (134.4 kg)   BMI 40.62 kg/m       Objective:   Physical Exam  Constitutional: He is oriented to person, place, and time. He appears well-developed and well-nourished. No distress.  HENT:  Head: Normocephalic and atraumatic.  Right Ear: External ear normal.  Left Ear: External ear normal.  Nose: Nose normal.  Mouth/Throat: Oropharynx is clear and moist. No oropharyngeal exudate.  Eyes: Conjunctivae and EOM are normal. Pupils are equal, round, and reactive to light. Right eye exhibits no  discharge. Left eye exhibits no discharge. No scleral icterus.  Neck: Normal range of motion. Neck supple. No JVD present. No tracheal deviation present. No thyromegaly present.  Cardiovascular: Normal rate, regular rhythm, normal heart sounds and intact distal pulses.  Exam reveals no gallop and no friction rub.   No murmur heard. Pulmonary/Chest: Effort normal and breath sounds normal. No stridor. No respiratory distress. He has no wheezes. He has no rales. He exhibits no tenderness.  Abdominal: Soft. Bowel sounds are normal. He exhibits no distension and no mass. There is no tenderness. There is no rebound and no guarding.  Morbidly obese   Musculoskeletal: Normal range of motion.  Lymphadenopathy:    He has no cervical adenopathy.  Neurological: He is alert and oriented to person, place, and time. He has normal reflexes. He displays normal reflexes. No cranial nerve deficit. He exhibits normal muscle tone. Coordination normal.  Skin: Skin is warm and dry. No rash noted. He is not diaphoretic. No erythema. No pallor.  Psychiatric: He has a normal mood and affect. His behavior is normal. Judgment and thought content normal.  Nursing note and vitals reviewed.     Assessment & Plan:  1. Routine general medical examination at a health care facility - Spoke about diet and ways to lose weight.  - Pneumovax 23 given  - Follow up in one year or sooner if needed - Basic metabolic panel - CBC with Differential/Platelet - Hepatic function panel - Lipid panel - PSA - TSH  2. Mixed hyperlipidemia - Basic metabolic panel - CBC with Differential/Platelet - Hepatic function panel - Lipid panel - PSA - TSH - atorvastatin (LIPITOR) 20 MG tablet; Take 1 tablet (20 mg total) by mouth daily.  Dispense: 90 tablet; Refill: 3  3. Essential hypertension - Well controlled with Lisinopril 40mg  - Basic metabolic panel - CBC with Differential/Platelet - Hepatic function panel - Lipid panel - PSA -  TSH  4. Need for hepatitis C screening test  - Hep C Antibody  5. Class 3 severe obesity due to excess calories with serious comorbidity and body mass index (BMI) of 40.0 to 44.9 in adult [facility]) - Needs to work on cutting out carbs and soda. Eat more lean meats, fruits, vegetables - Basic metabolic panel - CBC with Differential/Platelet - Hepatic function panel - Lipid panel - PSA - TSH  Dorothyann Peng, NP

## 2017-06-05 NOTE — Patient Instructions (Signed)
It was great seeing you today   Please work on cutting on sodas and pastas. Eat more fruits, vegetables, and lean meats.   I will follow up with you regarding your blood work   I would also like for you to sign up for an annual wellness visit on a Friday with our nurse Manuela Schwartz. This is a free benefit under medicare that may help [place] find additional ways to help you.

## 2017-06-06 LAB — HEPATITIS C ANTIBODY: HCV Ab: NEGATIVE

## 2017-06-09 ENCOUNTER — Telehealth: Payer: Self-pay | Admitting: Adult Health

## 2017-06-09 NOTE — Telephone Encounter (Addendum)
Pt states he is returning you call. Pt aware you are out on Mondays.

## 2017-06-10 ENCOUNTER — Other Ambulatory Visit: Payer: Self-pay | Admitting: Adult Health

## 2017-06-10 DIAGNOSIS — K006 Disturbances in tooth eruption: Secondary | ICD-10-CM | POA: Diagnosis not present

## 2017-06-10 DIAGNOSIS — R972 Elevated prostate specific antigen [PSA]: Secondary | ICD-10-CM

## 2017-06-10 NOTE — Telephone Encounter (Signed)
See result note.  

## 2017-07-15 DIAGNOSIS — M170 Bilateral primary osteoarthritis of knee: Secondary | ICD-10-CM | POA: Diagnosis not present

## 2017-07-22 DIAGNOSIS — M170 Bilateral primary osteoarthritis of knee: Secondary | ICD-10-CM | POA: Diagnosis not present

## 2017-07-29 DIAGNOSIS — M170 Bilateral primary osteoarthritis of knee: Secondary | ICD-10-CM | POA: Diagnosis not present

## 2017-08-04 DIAGNOSIS — H401113 Primary open-angle glaucoma, right eye, severe stage: Secondary | ICD-10-CM | POA: Diagnosis not present

## 2017-08-04 DIAGNOSIS — H35352 Cystoid macular degeneration, left eye: Secondary | ICD-10-CM | POA: Diagnosis not present

## 2017-09-04 DIAGNOSIS — H35352 Cystoid macular degeneration, left eye: Secondary | ICD-10-CM | POA: Diagnosis not present

## 2017-09-11 DIAGNOSIS — M170 Bilateral primary osteoarthritis of knee: Secondary | ICD-10-CM | POA: Diagnosis not present

## 2017-09-22 ENCOUNTER — Other Ambulatory Visit: Payer: Self-pay | Admitting: Adult Health

## 2017-09-22 DIAGNOSIS — I10 Essential (primary) hypertension: Secondary | ICD-10-CM

## 2017-09-22 DIAGNOSIS — R351 Nocturia: Secondary | ICD-10-CM

## 2017-09-24 NOTE — Telephone Encounter (Signed)
Sent to the pharmacy by e-scribe. 

## 2017-12-04 DIAGNOSIS — H401123 Primary open-angle glaucoma, left eye, severe stage: Secondary | ICD-10-CM | POA: Diagnosis not present

## 2017-12-04 DIAGNOSIS — H35371 Puckering of macula, right eye: Secondary | ICD-10-CM | POA: Diagnosis not present

## 2017-12-04 DIAGNOSIS — H401113 Primary open-angle glaucoma, right eye, severe stage: Secondary | ICD-10-CM | POA: Diagnosis not present

## 2017-12-04 DIAGNOSIS — H401133 Primary open-angle glaucoma, bilateral, severe stage: Secondary | ICD-10-CM | POA: Diagnosis not present

## 2018-03-05 DIAGNOSIS — H401133 Primary open-angle glaucoma, bilateral, severe stage: Secondary | ICD-10-CM | POA: Diagnosis not present

## 2018-03-28 ENCOUNTER — Other Ambulatory Visit: Payer: Self-pay | Admitting: Adult Health

## 2018-03-28 DIAGNOSIS — R351 Nocturia: Secondary | ICD-10-CM

## 2018-03-31 NOTE — Telephone Encounter (Signed)
Sent to the pharmacy by e-scribe for 90 days.  Pt due back for cpx 05/2018.

## 2018-04-01 ENCOUNTER — Encounter: Payer: Self-pay | Admitting: Gastroenterology

## 2018-04-08 ENCOUNTER — Telehealth: Payer: Self-pay | Admitting: Adult Health

## 2018-04-08 NOTE — Telephone Encounter (Signed)
Copied from [place] [id number]. Topic: General - Other >> Apr 08, 2018 10:15 AM Darl Householder, RMA wrote: Reason for CRM: Patient is requesting a call back concerning AWV please return call to [phone]

## 2018-04-15 ENCOUNTER — Encounter: Payer: Self-pay | Admitting: Gastroenterology

## 2018-04-17 ENCOUNTER — Other Ambulatory Visit: Payer: Self-pay | Admitting: Adult Health

## 2018-04-17 ENCOUNTER — Ambulatory Visit (INDEPENDENT_AMBULATORY_CARE_PROVIDER_SITE_OTHER): Payer: Medicare HMO | Admitting: Adult Health

## 2018-04-17 ENCOUNTER — Encounter: Payer: Self-pay | Admitting: Adult Health

## 2018-04-17 VITALS — BP 118/74 | Temp 97.4°F | Wt 288.0 lb

## 2018-04-17 DIAGNOSIS — S39012A Strain of muscle, fascia and tendon of lower back, initial encounter: Secondary | ICD-10-CM

## 2018-04-17 DIAGNOSIS — S46911A Strain of unspecified muscle, fascia and tendon at shoulder and upper arm level, right arm, initial encounter: Secondary | ICD-10-CM | POA: Diagnosis not present

## 2018-04-17 DIAGNOSIS — T148XXA Other injury of unspecified body region, initial encounter: Secondary | ICD-10-CM

## 2018-04-17 MED ORDER — IBUPROFEN 600 MG PO TABS: 600.0000 mg | ORAL_TABLET | Freq: Three times a day (TID) | ORAL | 0 refills | Status: DC | PRN

## 2018-04-17 MED ORDER — KETOROLAC TROMETHAMINE 60 MG/2ML IM SOLN
60.0000 mg | Freq: Once | INTRAMUSCULAR | Status: AC
  Administered 2018-04-17: 60 mg via INTRAMUSCULAR

## 2018-04-17 MED ORDER — CYCLOBENZAPRINE HCL 10 MG PO TABS: 10.0000 mg | ORAL_TABLET | Freq: Two times a day (BID) | ORAL | 0 refills | Status: DC | PRN

## 2018-04-17 NOTE — Patient Instructions (Signed)
It was great seeing you today   We gave you a shot of Toradol for pain relief.   I have sent in a muscle relaxer (Flexeril) and Motrin 600 mg that you can take. The muscle relaxer may make you sleepy.   Follow up if pain is not improving by the end of the weekend

## 2018-04-17 NOTE — Progress Notes (Signed)
Subjective:    Patient ID: Barney Russomanno, male    DOB: 08-02-1946, 72 y.o.   MRN: [id number]  HPI  72 year old male who  has a past medical history of Glaucoma, High blood pressure, Hyperlipidemia, and Morbid obesity ([place]).  He presents to the office for an acute issue of low back pain. He reports that about 1.5 weeks ago he was lifting heavy objects at work, a few days later he started to have pain in his shoulders, flank, and low back.  He has been using topical sports creams and Tylenol without relief of pain.  Walking, bending and twisting movements make pain worse.   He has not had any issues with bowel or bladder. He denies any falls.   Review of Systems See HPI   Past Medical History:  Diagnosis Date  . Glaucoma   . High blood pressure   . Hyperlipidemia   . Morbid obesity ([place])     Social History   Socioeconomic History  . Marital status: Single    Spouse name: Not on file  . Number of children: Not on file  . Years of education: Not on file  . Highest education level: Not on file  Occupational History  . Not on file  Social Needs  . Financial resource strain: Not on file  . Food insecurity:    Worry: Not on file    Inability: Not on file  . Transportation needs:    Medical: Not on file    Non-medical: Not on file  Tobacco Use  . Smoking status: Never Smoker  . Smokeless tobacco: Never Used  Substance and Sexual Activity  . Alcohol use: Yes    Alcohol/week: 0.0 oz  . Drug use: No  . Sexual activity: Not on file  Lifestyle  . Physical activity:    Days per week: Not on file    Minutes per session: Not on file  . Stress: Not on file  Relationships  . Social connections:    Talks on phone: Not on file    Gets together: Not on file    Attends religious service: Not on file    Active member of club or organization: Not on file    Attends meetings of clubs or organizations: Not on file    Relationship status: Not on file  . Intimate partner  violence:    Fear of current or ex partner: Not on file    Emotionally abused: Not on file    Physically abused: Not on file    Forced sexual activity: Not on file  Other Topics Concern  . Not on file  Social History Narrative   Works for [employer] married    Three children ( [place], [place], [place])     Family History  Problem Relation Age of Onset  . Cancer Mother        Larynx     No Known Allergies  Current Outpatient Medications on File Prior to Visit  Medication Sig Dispense Refill  . atorvastatin (LIPITOR) 20 MG tablet Take 1 tablet (20 mg total) by mouth daily. 90 tablet 3  . COMBIGAN 0.2-0.5 % ophthalmic solution INSTILL ONE GTT IN OU Q 12 H  5  . lisinopril (PRINIVIL,ZESTRIL) 40 MG tablet TAKE 1 TABLET(40 MG) BY MOUTH DAILY 90 tablet 1  . tamsulosin (FLOMAX) 0.4 MG CAPS capsule TAKE 1 CAPSULE BY MOUTH EVERY DAY 90 capsule 0  . TRAVATAN Z 0.004 %  SOLN ophthalmic solution INSTILL 1 DROP INTO BOTH EYES IN THE EVENING  3   No current facility-administered medications on file prior to visit.     BP 118/74   Temp (!) 97.4 F (36.3 C) (Oral)   Wt 288 lb (130.6 kg)   BMI 39.50 kg/m       Objective:   Physical Exam  Constitutional: He is oriented to person, place, and time. He appears well-developed and well-nourished. No distress.  Cardiovascular: Normal rate, regular rhythm, normal heart sounds and intact distal pulses. Exam reveals no gallop and no friction rub.  No murmur heard. Pulmonary/Chest: Effort normal and breath sounds normal. No stridor. No respiratory distress. He has no wheezes. He has no rales. He exhibits no tenderness.  Musculoskeletal: He exhibits tenderness.  Tenderness with palpation along low back and shoulder blades. No spinal tenderness noted.   Neurological: He is alert and oriented to person, place, and time. He displays normal reflexes. No cranial nerve deficit or sensory deficit. He exhibits normal muscle tone. Coordination  normal.  Skin: He is not diaphoretic.  Psychiatric: He has a normal mood and affect. His behavior is normal. Judgment and thought content normal.  Nursing note and vitals reviewed.     Assessment & Plan:  1. Muscle strain - cyclobenzaprine (FLEXERIL) 10 MG tablet; Take 1 tablet (10 mg total) by mouth 2 (two) times daily as needed for muscle spasms.  Dispense: 30 tablet; Refill: 0 - ketorolac (TORADOL) injection 60 mg - ibuprofen (ADVIL,MOTRIN) 600 MG tablet; Take 1 tablet (600 mg total) by mouth every 8 (eight) hours as needed.  Dispense: 30 tablet; Refill: 0  Dorothyann Peng, NP

## 2018-04-23 ENCOUNTER — Inpatient Hospital Stay (HOSPITAL_COMMUNITY)
Admission: EM | Admit: 2018-04-23 | Discharge: 2018-05-01 | DRG: 477 | Disposition: A | Discharge status: Skilled Nursing Facility | Payer: Medicare HMO | Attending: Internal Medicine | Admitting: Internal Medicine

## 2018-04-23 ENCOUNTER — Other Ambulatory Visit: Payer: Self-pay

## 2018-04-23 ENCOUNTER — Encounter (HOSPITAL_COMMUNITY): Payer: Self-pay | Admitting: Emergency Medicine

## 2018-04-23 DIAGNOSIS — H409 Unspecified glaucoma: Secondary | ICD-10-CM | POA: Diagnosis present

## 2018-04-23 DIAGNOSIS — M6281 Muscle weakness (generalized): Secondary | ICD-10-CM | POA: Diagnosis not present

## 2018-04-23 DIAGNOSIS — R0602 Shortness of breath: Secondary | ICD-10-CM | POA: Diagnosis not present

## 2018-04-23 DIAGNOSIS — I10 Essential (primary) hypertension: Secondary | ICD-10-CM | POA: Diagnosis present

## 2018-04-23 DIAGNOSIS — R2689 Other abnormalities of gait and mobility: Secondary | ICD-10-CM | POA: Diagnosis not present

## 2018-04-23 DIAGNOSIS — E785 Hyperlipidemia, unspecified: Secondary | ICD-10-CM | POA: Diagnosis present

## 2018-04-23 DIAGNOSIS — G930 Cerebral cysts: Secondary | ICD-10-CM | POA: Diagnosis present

## 2018-04-23 DIAGNOSIS — W08XXXA Fall from other furniture, initial encounter: Secondary | ICD-10-CM | POA: Diagnosis present

## 2018-04-23 DIAGNOSIS — R0600 Dyspnea, unspecified: Secondary | ICD-10-CM | POA: Diagnosis not present

## 2018-04-23 DIAGNOSIS — R531 Weakness: Secondary | ICD-10-CM

## 2018-04-23 DIAGNOSIS — N2889 Other specified disorders of kidney and ureter: Secondary | ICD-10-CM | POA: Diagnosis not present

## 2018-04-23 DIAGNOSIS — C801 Malignant (primary) neoplasm, unspecified: Secondary | ICD-10-CM | POA: Diagnosis not present

## 2018-04-23 DIAGNOSIS — G893 Neoplasm related pain (acute) (chronic): Secondary | ICD-10-CM | POA: Diagnosis not present

## 2018-04-23 DIAGNOSIS — C7951 Secondary malignant neoplasm of bone: Secondary | ICD-10-CM | POA: Diagnosis present

## 2018-04-23 DIAGNOSIS — T796XXA Traumatic ischemia of muscle, initial encounter: Secondary | ICD-10-CM | POA: Diagnosis present

## 2018-04-23 DIAGNOSIS — E8770 Fluid overload, unspecified: Secondary | ICD-10-CM | POA: Diagnosis not present

## 2018-04-23 DIAGNOSIS — R404 Transient alteration of awareness: Secondary | ICD-10-CM | POA: Diagnosis not present

## 2018-04-23 DIAGNOSIS — Z6838 Body mass index (BMI) 38.0-38.9, adult: Secondary | ICD-10-CM

## 2018-04-23 DIAGNOSIS — Z79899 Other long term (current) drug therapy: Secondary | ICD-10-CM | POA: Diagnosis not present

## 2018-04-23 DIAGNOSIS — M8458XA Pathological fracture in neoplastic disease, other specified site, initial encounter for fracture: Secondary | ICD-10-CM | POA: Diagnosis present

## 2018-04-23 DIAGNOSIS — R4182 Altered mental status, unspecified: Secondary | ICD-10-CM | POA: Diagnosis not present

## 2018-04-23 DIAGNOSIS — C7800 Secondary malignant neoplasm of unspecified lung: Secondary | ICD-10-CM | POA: Diagnosis present

## 2018-04-23 DIAGNOSIS — Z743 Need for continuous supervision: Secondary | ICD-10-CM | POA: Diagnosis not present

## 2018-04-23 DIAGNOSIS — C649 Malignant neoplasm of unspecified kidney, except renal pelvis: Secondary | ICD-10-CM | POA: Diagnosis present

## 2018-04-23 DIAGNOSIS — N179 Acute kidney failure, unspecified: Secondary | ICD-10-CM | POA: Diagnosis present

## 2018-04-23 DIAGNOSIS — G9341 Metabolic encephalopathy: Secondary | ICD-10-CM | POA: Diagnosis not present

## 2018-04-23 DIAGNOSIS — R972 Elevated prostate specific antigen [PSA]: Secondary | ICD-10-CM | POA: Diagnosis present

## 2018-04-23 DIAGNOSIS — M899 Disorder of bone, unspecified: Secondary | ICD-10-CM | POA: Diagnosis not present

## 2018-04-23 DIAGNOSIS — C799 Secondary malignant neoplasm of unspecified site: Secondary | ICD-10-CM

## 2018-04-23 DIAGNOSIS — K8020 Calculus of gallbladder without cholecystitis without obstruction: Secondary | ICD-10-CM | POA: Diagnosis not present

## 2018-04-23 DIAGNOSIS — E860 Dehydration: Secondary | ICD-10-CM | POA: Diagnosis present

## 2018-04-23 DIAGNOSIS — R079 Chest pain, unspecified: Secondary | ICD-10-CM | POA: Diagnosis not present

## 2018-04-23 DIAGNOSIS — C642 Malignant neoplasm of left kidney, except renal pelvis: Secondary | ICD-10-CM | POA: Diagnosis not present

## 2018-04-23 DIAGNOSIS — R279 Unspecified lack of coordination: Secondary | ICD-10-CM | POA: Diagnosis not present

## 2018-04-23 DIAGNOSIS — N401 Enlarged prostate with lower urinary tract symptoms: Secondary | ICD-10-CM | POA: Diagnosis not present

## 2018-04-23 DIAGNOSIS — R278 Other lack of coordination: Secondary | ICD-10-CM | POA: Diagnosis not present

## 2018-04-23 LAB — BASIC METABOLIC PANEL
Anion gap: 13 (ref 5–15)
BUN: 46 mg/dL — ABNORMAL HIGH (ref 6–20)
CHLORIDE: 105 mmol/L (ref 101–111)
CO2: 23 mmol/L (ref 22–32)
CREATININE: 2.36 mg/dL — AB (ref 0.61–1.24)
Calcium: >15 mg/dL (ref 8.9–10.3)
GFR calc non Af Amer: 26 mL/min — ABNORMAL LOW (ref >60)
GFR, EST AFRICAN AMERICAN: 31 mL/min — AB (ref >60)
Glucose, Bld: 103 mg/dL — ABNORMAL HIGH (ref 65–99)
Potassium: 4.5 mmol/L (ref 3.5–5.1)
Sodium: 141 mmol/L (ref 135–145)

## 2018-04-23 LAB — CBC
HEMATOCRIT: 34.3 % — AB (ref 39.0–52.0)
Hemoglobin: 11.3 g/dL — ABNORMAL LOW (ref 13.0–17.0)
MCH: 29.7 pg (ref 26.0–34.0)
MCHC: 32.9 g/dL (ref 30.0–36.0)
MCV: 90 fL (ref 78.0–100.0)
PLATELETS: 271 10*3/uL (ref 150–400)
RBC: 3.81 MIL/uL — AB (ref 4.22–5.81)
RDW: 13.3 % (ref 11.5–15.5)
WBC: 7.7 10*3/uL (ref 4.0–10.5)

## 2018-04-23 LAB — CK: Total CK: 196 U/L (ref 49–397)

## 2018-04-23 LAB — CBG MONITORING, ED: Glucose-Capillary: 98 mg/dL (ref 65–99)

## 2018-04-23 MED ORDER — SODIUM CHLORIDE 0.9 % IV BOLUS (SEPSIS)
1000.0000 mL | Freq: Once | INTRAVENOUS | Status: AC
  Administered 2018-04-23: 1000 mL via INTRAVENOUS

## 2018-04-23 MED ORDER — SODIUM CHLORIDE 0.9 % IV SOLN
Freq: Once | INTRAVENOUS | Status: AC
  Administered 2018-04-23: 23:00:00 via INTRAVENOUS

## 2018-04-23 MED ORDER — SODIUM CHLORIDE 0.9 % IV SOLN
60.0000 mg | Freq: Once | INTRAVENOUS | Status: AC
  Administered 2018-04-23: 60 mg via INTRAVENOUS
  Filled 2018-04-23: qty 10

## 2018-04-23 NOTE — ED Notes (Signed)
Pt has a condom catheter in place

## 2018-04-23 NOTE — ED Triage Notes (Signed)
Per EMS pt comes from home  Pt was up and walking around yesterday while a friend was visiting and took his medication prior to the friend leaving  Pt is on 2 new medications a pain medication and one is a muscle relaxer  After the friend left he slid to the ground as his legs gave out  Pt has been laying there since  The friend came back today and found him laying in the hall  Denies any new pain but pt has chronic back pain  Friend states pt has not been eating or drinking well for the past few days

## 2018-04-23 NOTE — ED Notes (Signed)
Pt is aware a urine sample is needed. Urinal at bedside.  

## 2018-04-23 NOTE — ED Provider Notes (Addendum)
[place] DEPT Provider Note   CSN: [id number] Arrival date & time: 04/23/18  2024     History   Chief Complaint Chief Complaint  Patient presents with  . Weakness    HPI Michael [name] is a 72 y.o. male. With essential hypertension, hyperlipidemia, morbid obesity who presents post a fall. The patient states that he was sitting on his couch reading a book and when he was adjusting himself he slipped and fell off the couch. He had denies any dizziness, heart racing, or shortness of breath prior to the fall. He did not hit his head or have any other superficial skin trauma. He was not able to pull himself up off the ground and remained on the floor since yesterday. He was finally able to call his friend who helped him get to the hospital.   The patient mentioned that he has been having a decreased appetite for the past 3 weeks and he has been feeling extremely weak. 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   Past Medical History:  Diagnosis Date  . Glaucoma   . High blood pressure   . Hyperlipidemia   . Morbid obesity [facility])     Patient Active Problem List   Diagnosis Date Noted  . Hyperlipidemia 02/24/2008  . OBESITY 01/22/2008  . Essential hypertension 01/22/2008  . ABNORMAL EJACULATION 01/22/2008  . SKIN LESION 01/22/2008    Past Surgical History:  Procedure Laterality Date  . cataract surgery          Home Medications    Prior to Admission medications   Medication Sig Start Date End Date Taking? Authorizing Provider  atorvastatin (LIPITOR) 20 MG tablet Take 1 tablet (20 mg total) by mouth daily. 06/05/17  Yes Nafziger, Tommi Rumps, NP  COMBIGAN 0.2-0.5 % ophthalmic solution INSTILL ONE GTT IN OU Q 12 H 04/07/18  Yes [provider]  cyclobenzaprine (FLEXERIL) 10 MG tablet Take 1 tablet (10 mg total) by mouth 2 (two) times daily as needed for muscle spasms. 04/17/18  Yes Nafziger, Tommi Rumps, NP  ibuprofen  (ADVIL,MOTRIN) 600 MG tablet Take 1 tablet (600 mg total) by mouth every 8 (eight) hours as needed. 04/17/18  Yes Nafziger, Tommi Rumps, NP  lisinopril (PRINIVIL,ZESTRIL) 40 MG tablet TAKE 1 TABLET(40 MG) BY MOUTH DAILY 09/24/17  Yes Nafziger, Tommi Rumps, NP  tamsulosin (FLOMAX) 0.4 MG CAPS capsule TAKE 1 CAPSULE BY MOUTH EVERY DAY 03/31/18  Yes Nafziger, Tommi Rumps, NP  TRAVATAN Z 0.004 % SOLN ophthalmic solution INSTILL 1 DROP INTO BOTH EYES IN THE EVENING 04/07/18  Yes [provider]    Family History Family History  Problem Relation Age of Onset  . Cancer Mother        Larynx     Social History Social History   Tobacco Use  . Smoking status: Never Smoker  . Smokeless tobacco: Never Used  Substance Use Topics  . Alcohol use: Yes    Alcohol/week: 0.0 oz  . Drug use: No     Allergies   Patient has no known allergies.   Review of Systems Review of Systems  Eyes: Positive for visual disturbance.  Respiratory: Negative for shortness of breath.   Cardiovascular: Negative for chest pain.  Neurological: Negative for headaches.     Physical Exam Updated Vital Signs BP (!) 116/48 (BP Location: Left Arm)   Pulse 79   Temp 97.6 F (36.4 C) (Oral)   Resp 17   Ht 6' (1.829  m)   Wt 131.5 kg (290 lb)   SpO2 96%   BMI 39.33 kg/m   Physical Exam  Constitutional: He is oriented to person, place, and time. He appears well-developed and well-nourished. No distress.  HENT:  Head: Normocephalic and atraumatic.  Cardiovascular: Normal rate, regular rhythm and normal heart sounds.  Pulmonary/Chest: Effort normal and breath sounds normal. No stridor. No respiratory distress. He exhibits tenderness (left lower chest).  Abdominal: Soft. Bowel sounds are normal. He exhibits no distension. There is no tenderness.  Musculoskeletal: He exhibits no edema.  Neurological: He is alert and oriented to person, place, and time. He displays normal reflexes. No sensory deficit. He exhibits normal muscle  tone.  5/5 bilateral upper and lower extremities  Skin: He is not diaphoretic.  Psychiatric: He has a normal mood and affect. His behavior is normal. Judgment and thought content normal.    ED Treatments / Results  Labs (all labs ordered are listed, but only abnormal results are displayed) Labs Reviewed  BASIC METABOLIC PANEL - Abnormal; Notable for the following components:      Result Value   Glucose, Bld 103 (*)    BUN 46 (*)    Creatinine, Ser 2.36 (*)    Calcium >15.0 (*)    GFR calc non Af Amer 26 (*)    GFR calc Af Amer 31 (*)    All other components within normal limits  CBC - Abnormal; Notable for the following components:   RBC 3.81 (*)    Hemoglobin 11.3 (*)    HCT 34.3 (*)    All other components within normal limits  URINALYSIS, ROUTINE W REFLEX MICROSCOPIC  COMPREHENSIVE METABOLIC PANEL  CK  CALCIUM, IONIZED  CORTISOL  CBG MONITORING, ED    EKG None  Radiology No results found.  Procedures Procedures (including critical care time)  Medications Ordered in ED Medications  pamidronate (AREDIA) 60 mg in sodium chloride 0.9 % 500 mL IVPB (has no administration in time range)  sodium chloride 0.9 % bolus 1,000 mL (1,000 mLs Intravenous New Bag/Given 04/23/18 2156)  0.9 %  sodium chloride infusion ( Intravenous New Bag/Given 04/23/18 2246)     Initial Impression / Assessment and Plan / ED Course  I have reviewed the triage vital signs and the nursing notes.  Pertinent labs & imaging results that were available during my care of the patient were reviewed by me and considered in my medical decision making (see chart for details).  Dehydration and deconditioning The patient states that he has been having decreased appetite for the past 3 weeks and has been feeling very weak.   Rhabdomyolysis The patient was found laying down on floor of house for 1 day after a fall.  -CK pending -IV NS 1L bolus -NS infusion at 251ml/hr  -Hepatic function panel    Hypercalcemia Patient had a calcium level of >15 -ionized calcium  -IV pamidronate 60mg  over 4 hrs -PTH and calcium  -ionized calcium   Acute renal injury Patient has a creatinine level of 2.36 which is increased from baseline of 1.0. The patient's kidney injury is likely secondary to pre-renal injury from decreased oral intake.   -IV fluids  Final Clinical Impressions(s) / ED Diagnoses   Final diagnoses:  None    ED Discharge Orders    None       Lars Mage, MD 04/23/18 2257    Lars Mage, MD 04/23/18 [phone]

## 2018-04-23 NOTE — ED Provider Notes (Signed)
I have personally seen and examined the patient. I have reviewed the documentation on PMH/FH/Soc Hx. I have discussed the plan of care with the resident and patient.  I have reviewed and agree with the resident's documentation. Please see associated encounter note.  Briefly, the patient is a 72 y.o. male here with generalized fatigue for several weeks with difficulty ambulating who had a fall after sliding off the couch yesterday was unable to get up off the ground.  Patient was able to reach a friend who called EMS.  Patient denied any head trauma or loss of consciousness due to the fall.  No back pain, leg pain.  He denies any recent fevers or infections.  Denies any daily alcohol use.  No illicit drug use.  Denies any chest pain or shortness of breath.  On exam patient has 5 out of 5 strength in lower extremities.  Sensation intact.  Low suspicion for cauda equina.  Work-up revealed renal insufficiency and significant hypercalcemia of undetermined etiology.  Patient started on IV fluids and bisphosphonate.  Case was discussed with medicine who will admit the patient for continued work-up and management.  EKG Interpretation  Date/Time:    Ventricular Rate:    PR Interval:    QRS Duration:   QT Interval:    QTC Calculation:   R Axis:     Text Interpretation:         CRITICAL CARE Performed by: Grayce Sessions Cardama Total critical care time: 30 minutes Critical care time was exclusive of separately billable procedures and treating other patients. Critical care was necessary to treat or prevent imminent or life-threatening deterioration. Critical care was time spent personally by me on the following activities: development of treatment plan with patient and/or surrogate as well as nursing, discussions with consultants, evaluation of patient's response to treatment, examination of patient, obtaining history from patient or surrogate, ordering and performing treatments and interventions, ordering  and review of laboratory studies, ordering and review of radiographic studies, pulse oximetry and re-evaluation of patient's condition.     Fatima Blank, MD 04/23/18 [phone]

## 2018-04-24 ENCOUNTER — Emergency Department (HOSPITAL_COMMUNITY): Payer: Medicare HMO

## 2018-04-24 DIAGNOSIS — R531 Weakness: Secondary | ICD-10-CM

## 2018-04-24 DIAGNOSIS — N179 Acute kidney failure, unspecified: Secondary | ICD-10-CM | POA: Diagnosis present

## 2018-04-24 DIAGNOSIS — E8770 Fluid overload, unspecified: Secondary | ICD-10-CM | POA: Diagnosis not present

## 2018-04-24 DIAGNOSIS — G893 Neoplasm related pain (acute) (chronic): Secondary | ICD-10-CM | POA: Diagnosis not present

## 2018-04-24 DIAGNOSIS — C649 Malignant neoplasm of unspecified kidney, except renal pelvis: Secondary | ICD-10-CM | POA: Diagnosis present

## 2018-04-24 DIAGNOSIS — H409 Unspecified glaucoma: Secondary | ICD-10-CM | POA: Diagnosis present

## 2018-04-24 DIAGNOSIS — C642 Malignant neoplasm of left kidney, except renal pelvis: Secondary | ICD-10-CM | POA: Diagnosis not present

## 2018-04-24 DIAGNOSIS — T796XXA Traumatic ischemia of muscle, initial encounter: Secondary | ICD-10-CM | POA: Diagnosis present

## 2018-04-24 DIAGNOSIS — I10 Essential (primary) hypertension: Secondary | ICD-10-CM | POA: Diagnosis present

## 2018-04-24 DIAGNOSIS — C7800 Secondary malignant neoplasm of unspecified lung: Secondary | ICD-10-CM | POA: Diagnosis present

## 2018-04-24 DIAGNOSIS — W08XXXA Fall from other furniture, initial encounter: Secondary | ICD-10-CM | POA: Diagnosis present

## 2018-04-24 DIAGNOSIS — C799 Secondary malignant neoplasm of unspecified site: Secondary | ICD-10-CM | POA: Diagnosis not present

## 2018-04-24 DIAGNOSIS — E785 Hyperlipidemia, unspecified: Secondary | ICD-10-CM | POA: Diagnosis present

## 2018-04-24 DIAGNOSIS — Z6838 Body mass index (BMI) 38.0-38.9, adult: Secondary | ICD-10-CM | POA: Diagnosis not present

## 2018-04-24 DIAGNOSIS — G9341 Metabolic encephalopathy: Secondary | ICD-10-CM | POA: Diagnosis not present

## 2018-04-24 DIAGNOSIS — M8458XA Pathological fracture in neoplastic disease, other specified site, initial encounter for fracture: Secondary | ICD-10-CM | POA: Diagnosis present

## 2018-04-24 DIAGNOSIS — N2889 Other specified disorders of kidney and ureter: Secondary | ICD-10-CM | POA: Diagnosis not present

## 2018-04-24 DIAGNOSIS — G930 Cerebral cysts: Secondary | ICD-10-CM | POA: Diagnosis present

## 2018-04-24 DIAGNOSIS — E860 Dehydration: Secondary | ICD-10-CM | POA: Diagnosis present

## 2018-04-24 DIAGNOSIS — C7951 Secondary malignant neoplasm of bone: Secondary | ICD-10-CM | POA: Diagnosis present

## 2018-04-24 DIAGNOSIS — Z79899 Other long term (current) drug therapy: Secondary | ICD-10-CM | POA: Diagnosis not present

## 2018-04-24 DIAGNOSIS — R972 Elevated prostate specific antigen [PSA]: Secondary | ICD-10-CM | POA: Diagnosis present

## 2018-04-24 LAB — BASIC METABOLIC PANEL
ANION GAP: 11 (ref 5–15)
ANION GAP: 10 (ref 5–15)
ANION GAP: 10 (ref 5–15)
Anion gap: 10 (ref 5–15)
BUN: 44 mg/dL — ABNORMAL HIGH (ref 6–20)
BUN: 41 mg/dL — AB (ref 6–20)
BUN: 44 mg/dL — ABNORMAL HIGH (ref 6–20)
BUN: 42 mg/dL — AB (ref 6–20)
CALCIUM: 14.1 mg/dL — AB (ref 8.9–10.3)
CHLORIDE: 110 mmol/L (ref 101–111)
CO2: 23 mmol/L (ref 22–32)
CO2: 22 mmol/L (ref 22–32)
CO2: 25 mmol/L (ref 22–32)
CO2: 25 mmol/L (ref 22–32)
Calcium: 13.6 mg/dL (ref 8.9–10.3)
Calcium: 13.3 mg/dL (ref 8.9–10.3)
Calcium: 13.2 mg/dL (ref 8.9–10.3)
Chloride: 108 mmol/L (ref 101–111)
Chloride: 108 mmol/L (ref 101–111)
Chloride: 109 mmol/L (ref 101–111)
Creatinine, Ser: 2.24 mg/dL — ABNORMAL HIGH (ref 0.61–1.24)
Creatinine, Ser: 2.14 mg/dL — ABNORMAL HIGH (ref 0.61–1.24)
Creatinine, Ser: 2.15 mg/dL — ABNORMAL HIGH (ref 0.61–1.24)
Creatinine, Ser: 2.15 mg/dL — ABNORMAL HIGH (ref 0.61–1.24)
GFR calc Af Amer: 33 mL/min — ABNORMAL LOW (ref >60)
GFR calc Af Amer: 35 mL/min — ABNORMAL LOW (ref >60)
GFR calc Af Amer: 34 mL/min — ABNORMAL LOW (ref >60)
GFR calc Af Amer: 34 mL/min — ABNORMAL LOW (ref >60)
GFR calc non Af Amer: 30 mL/min — ABNORMAL LOW (ref >60)
GFR, EST NON AFRICAN AMERICAN: 28 mL/min — AB (ref >60)
GFR, EST NON AFRICAN AMERICAN: 30 mL/min — AB (ref >60)
GFR, EST NON AFRICAN AMERICAN: 30 mL/min — AB (ref >60)
GLUCOSE: 117 mg/dL — AB (ref 65–99)
GLUCOSE: 110 mg/dL — AB (ref 65–99)
GLUCOSE: 109 mg/dL — AB (ref 65–99)
GLUCOSE: 106 mg/dL — AB (ref 65–99)
POTASSIUM: 3.6 mmol/L (ref 3.5–5.1)
POTASSIUM: 4.2 mmol/L (ref 3.5–5.1)
POTASSIUM: 4 mmol/L (ref 3.5–5.1)
POTASSIUM: 3.9 mmol/L (ref 3.5–5.1)
Sodium: 141 mmol/L (ref 135–145)
Sodium: 141 mmol/L (ref 135–145)
Sodium: 144 mmol/L (ref 135–145)
Sodium: 145 mmol/L (ref 135–145)

## 2018-04-24 LAB — HEPATIC FUNCTION PANEL
ALT: 32 U/L (ref 17–63)
AST: 36 U/L (ref 15–41)
Albumin: 3.1 g/dL — ABNORMAL LOW (ref 3.5–5.0)
Alkaline Phosphatase: 175 U/L — ABNORMAL HIGH (ref 38–126)
BILIRUBIN DIRECT: 0.3 mg/dL (ref 0.1–0.5)
BILIRUBIN INDIRECT: 0.9 mg/dL (ref 0.3–0.9)
TOTAL PROTEIN: 6.5 g/dL (ref 6.5–8.1)
Total Bilirubin: 1.2 mg/dL (ref 0.3–1.2)

## 2018-04-24 LAB — CORTISOL: CORTISOL PLASMA: 19 ug/dL

## 2018-04-24 MED ORDER — IOHEXOL 300 MG/ML  SOLN
30.0000 mL | Freq: Once | INTRAMUSCULAR | Status: AC | PRN
  Administered 2018-04-24: 30 mL via ORAL

## 2018-04-24 MED ORDER — TAMSULOSIN HCL 0.4 MG PO CAPS
0.4000 mg | ORAL_CAPSULE | Freq: Every day | ORAL | Status: DC
  Administered 2018-04-24 – 2018-05-01 (×8): 0.4 mg via ORAL
  Filled 2018-04-24 (×9): qty 1

## 2018-04-24 MED ORDER — ONDANSETRON HCL 4 MG/2ML IJ SOLN: 4.0000 mg | Freq: Four times a day (QID) | INTRAMUSCULAR | Status: DC | PRN

## 2018-04-24 MED ORDER — SODIUM CHLORIDE 0.9 % IV SOLN
INTRAVENOUS | Status: DC
  Administered 2018-04-24 – 2018-04-26 (×9): via INTRAVENOUS

## 2018-04-24 MED ORDER — BRIMONIDINE TARTRATE-TIMOLOL 0.2-0.5 % OP SOLN
1.0000 [drp] | Freq: Two times a day (BID) | OPHTHALMIC | Status: DC
  Filled 2018-04-24: qty 5

## 2018-04-24 MED ORDER — HEPARIN SODIUM (PORCINE) 5000 UNIT/ML IJ SOLN
5000.0000 [IU] | Freq: Three times a day (TID) | INTRAMUSCULAR | Status: DC
  Administered 2018-04-24 – 2018-04-27 (×10): 5000 [IU] via SUBCUTANEOUS
  Filled 2018-04-24 (×10): qty 1

## 2018-04-24 MED ORDER — ACETAMINOPHEN 325 MG PO TABS: 650.0000 mg | ORAL_TABLET | Freq: Four times a day (QID) | ORAL | Status: DC | PRN

## 2018-04-24 MED ORDER — ACETAMINOPHEN 650 MG RE SUPP: 650.0000 mg | Freq: Four times a day (QID) | RECTAL | Status: DC | PRN

## 2018-04-24 MED ORDER — LEVALBUTEROL HCL 0.63 MG/3ML IN NEBU
0.6300 mg | INHALATION_SOLUTION | Freq: Four times a day (QID) | RESPIRATORY_TRACT | Status: DC | PRN
  Administered 2018-04-28 – 2018-04-30 (×2): 0.63 mg via RESPIRATORY_TRACT
  Filled 2018-04-24 (×2): qty 3

## 2018-04-24 MED ORDER — TIMOLOL MALEATE 0.5 % OP SOLN
1.0000 [drp] | Freq: Two times a day (BID) | OPHTHALMIC | Status: DC
  Administered 2018-04-24 – 2018-05-01 (×15): 1 [drp] via OPHTHALMIC
  Filled 2018-04-24: qty 5

## 2018-04-24 MED ORDER — BRIMONIDINE TARTRATE 0.2 % OP SOLN
1.0000 [drp] | Freq: Two times a day (BID) | OPHTHALMIC | Status: DC
  Administered 2018-04-24 – 2018-05-01 (×15): 1 [drp] via OPHTHALMIC
  Filled 2018-04-24: qty 5

## 2018-04-24 MED ORDER — CALCITONIN (SALMON) 200 UNIT/ML IJ SOLN
400.0000 [IU] | Freq: Two times a day (BID) | INTRAMUSCULAR | Status: AC
  Administered 2018-04-24 – 2018-04-25 (×4): 400 [IU] via SUBCUTANEOUS
  Filled 2018-04-24 (×4): qty 2

## 2018-04-24 MED ORDER — LATANOPROST 0.005 % OP SOLN
1.0000 [drp] | Freq: Every day | OPHTHALMIC | Status: DC
  Administered 2018-04-24 – 2018-04-30 (×7): 1 [drp] via OPHTHALMIC
  Filled 2018-04-24: qty 2.5

## 2018-04-24 MED ORDER — ONDANSETRON HCL 4 MG PO TABS: 4.0000 mg | ORAL_TABLET | Freq: Four times a day (QID) | ORAL | Status: DC | PRN

## 2018-04-24 MED ORDER — HYDROCODONE-ACETAMINOPHEN 5-325 MG PO TABS
1.0000 | ORAL_TABLET | Freq: Four times a day (QID) | ORAL | Status: DC | PRN
  Administered 2018-04-24: 2 via ORAL
  Administered 2018-04-24: 1 via ORAL
  Administered 2018-04-25 – 2018-05-01 (×7): 2 via ORAL
  Filled 2018-04-24 (×9): qty 2

## 2018-04-24 MED ORDER — ALBUTEROL SULFATE (2.5 MG/3ML) 0.083% IN NEBU: 2.5000 mg | INHALATION_SOLUTION | Freq: Four times a day (QID) | RESPIRATORY_TRACT | Status: DC | PRN

## 2018-04-24 NOTE — Progress Notes (Signed)
Paged doctor and informed about patients recent vitals. Applied 3 L of oxygen with improvement. Will continue to monitor patient closely throughout the night.

## 2018-04-24 NOTE — Plan of Care (Signed)

## 2018-04-24 NOTE — Progress Notes (Signed)
CRITICAL VALUE ALERT  Critical Value:  Ca- 13.6  Date & Time Notied:  04/24/18 @ 4585  Provider Notified: Quincy Simmonds  Orders Received/Actions taken: Continue to monitor. Calcitonin being administered.

## 2018-04-24 NOTE — ED Notes (Signed)
ED TO INPATIENT HANDOFF REPORT  Name/Age/Gender Michael [name] 72 y.o. male  Code Status    Code Status Orders  (From admission, onward)        Start     Ordered   04/24/18 0310  Full code  Continuous     04/24/18 0310    Code Status History    This patient has a current code status but no historical code status.      Home/SNF/Other Home  Chief Complaint Dehydration  Level of Care/Admitting Diagnosis ED Disposition    ED Disposition Condition Comment   Admit  Hospital Area: [facility] [[id number]]  Level of Care: Telemetry [5]  Admit to tele based on following criteria: Other see comments  Comments: Hypercalcemia  Diagnosis: Hypercalcemia [275.42.ICD-9-CM]  Admitting Physician: Etta Quill [[id number]]  Attending Physician: Etta Quill [[id number]]  Estimated length of stay: past midnight tomorrow  Certification:: I certify this patient will need inpatient services for at least 2 midnights  PT Class (Do Not Modify): Inpatient [101]  PT Acc Code (Do Not Modify): Private [1]       Medical History Past Medical History:  Diagnosis Date  . Glaucoma   . High blood pressure   . Hyperlipidemia   . Morbid obesity ([place])     Allergies No Known Allergies  IV Location/Drains/Wounds Patient Lines/Drains/Airways Status   Active Line/Drains/Airways    Name:   Placement date:   Placement time:   Site:   Days:   Peripheral IV 04/23/18 Right Hand   04/23/18    2125    Hand   1          Labs/Imaging Results for orders placed or performed during the hospital encounter of 04/23/18 (from the past 48 hour(s))  Basic metabolic panel     Status: Abnormal   Collection Time: 04/23/18  9:41 PM  Result Value Ref Range   Sodium 141 135 - 145 mmol/L   Potassium 4.5 3.5 - 5.1 mmol/L   Chloride 105 101 - 111 mmol/L   CO2 23 22 - 32 mmol/L   Glucose, Bld 103 (H) 65 - 99 mg/dL   BUN 46 (H) 6 - 20 mg/dL   Creatinine, Ser 2.36 (H) 0.61 - 1.24 mg/dL    Calcium >15.0 (HH) 8.9 - 10.3 mg/dL    Comment: CRITICAL RESULT CALLED TO, READ BACK BY AND VERIFIED WITH: S ,RN _0  04/23/18 MKELLY    GFR calc non Af Amer 26 (L) >60 mL/min   GFR calc Af Amer 31 (L) >60 mL/min    Comment: (NOTE) The eGFR has been calculated using the CKD EPI equation. This calculation has not been validated in all clinical situations. eGFR's persistently <60 mL/min signify possible Chronic Kidney Disease.    Anion gap 13 5 - 15    Comment: Performed at [facility], [place] [address], [place], [place] [address]  CBC     Status: Abnormal   Collection Time: 04/23/18  9:41 PM  Result Value Ref Range   WBC 7.7 4.0 - 10.5 K/uL   RBC 3.81 (L) 4.22 - 5.81 MIL/uL   Hemoglobin 11.3 (L) 13.0 - 17.0 g/dL   HCT 34.3 (L) 39.0 - 52.0 %   MCV 90.0 78.0 - 100.0 fL   MCH 29.7 26.0 - 34.0 pg   MCHC 32.9 30.0 - 36.0 g/dL   RDW 13.3 11.5 - 15.5 %   Platelets 271 150 - 400 K/uL  Comment: Performed at [facility], [place] [address], [place], [place] [address]  CK     Status: None   Collection Time: 04/23/18  9:43 PM  Result Value Ref Range   Total CK 196 49 - 397 U/L    Comment: Performed at [facility], [place] [address] [place], [place] [address]  POC CBG, ED     Status: None   Collection Time: 04/23/18  9:50 PM  Result Value Ref Range   Glucose-Capillary 98 65 - 99 mg/dL  Cortisol     Status: None   Collection Time: 04/23/18 10:55 PM  Result Value Ref Range   Cortisol, Plasma 19.0 ug/dL    Comment: (NOTE) AM    6.7 - 22.6 ug/dL PM   <10.0       ug/dL Performed at [place] [address] [place], [place] [address]   Hepatic function panel     Status: Abnormal   Collection Time: 04/24/18  1:52 AM  Result Value Ref Range   Total Protein 6.5 6.5 - 8.1 g/dL   Albumin 3.1 (L) 3.5 - 5.0 g/dL   AST 36 15 - 41 U/L   ALT 32 17 - 63 U/L   Alkaline Phosphatase 175 (H) 38 - 126 U/L   Total Bilirubin 1.2  0.3 - 1.2 mg/dL   Bilirubin, Direct 0.3 0.1 - 0.5 mg/dL   Indirect Bilirubin 0.9 0.3 - 0.9 mg/dL    Comment: Performed at [facility], [place] [address] [place], [place] [address]   No results found.  Pending Labs Unresulted Labs (From admission, onward)   Start     Ordered   04/24/18 0233  Basic metabolic panel  Now then every 4 hours,   R     04/24/18 0206   04/23/18 2300  PTH, intact and calcium  STAT,   R     04/23/18 2259   04/23/18 2241  Calcium, ionized  Once,   R     04/23/18 2240   04/23/18 2048  Urinalysis, Routine w reflex microscopic  Once,   STAT     04/23/18 2048      Vitals/Pain Today's Vitals   04/24/18 0100 04/24/18 0130 04/24/18 0300 04/24/18 0330  BP: 127/73 131/61 135/71 (!) 153/92  Pulse: 80 79 79   Resp: 20 20 (!) 21 (!) 25  Temp:      TempSrc:      SpO2: 90% 95% 93%   Weight:      Height:        Isolation Precautions No active isolations  Medications Medications  pamidronate (AREDIA) 60 mg in sodium chloride 0.9 % 500 mL IVPB (60 mg Intravenous New Bag/Given 04/23/18 2356)  tamsulosin (FLOMAX) capsule 0.4 mg (has no administration in time range)  latanoprost (XALATAN) 0.005 % ophthalmic solution 1 drop (has no administration in time range)  brimonidine-timolol (COMBIGAN) 0.2-0.5 % ophthalmic solution 1 drop (has no administration in time range)  HYDROcodone-acetaminophen (NORCO/VICODIN) 5-325 MG per tablet 1-2 tablet (has no administration in time range)  0.9 %  sodium chloride infusion ( Intravenous New Bag/Given 04/24/18 0210)  calcitonin (MIACALCIN) injection 400 Units (400 Units Subcutaneous Given 04/24/18 0344)  acetaminophen (TYLENOL) tablet 650 mg (has no administration in time range)    Or  acetaminophen (TYLENOL) suppository 650 mg (has no administration in time range)  ondansetron (ZOFRAN) tablet 4 mg (has no administration in time range)    Or  ondansetron (ZOFRAN)  injection 4 mg (has no administration in time range)   heparin injection 5,000 Units (has no administration in time range)  sodium chloride 0.9 % bolus 1,000 mL (0 mLs Intravenous Stopped 04/23/18 2332)  0.9 %  sodium chloride infusion ( Intravenous Stopped 04/24/18 0211)  iohexol (OMNIPAQUE) 300 MG/ML solution 30 mL (30 mLs Oral Contrast Given 04/24/18 0212)    Mobility Non-ambulatory

## 2018-04-24 NOTE — Consult Note (Addendum)
Chief Complaint: Patient was seen in consultation today for CT guided left iliac bone lesion biopsy Chief Complaint  Patient presents with  . Weakness    Referring Physician(s): [place]  Supervising Physician: Arne [place]  Patient Status: [facility] - In-pt  History of Present Illness: Michael [name] is a 72 y.o. male with past medical history significant for glaucoma, hyperlipidemia, high blood pressure, morbid obesity, elevated PSA recent presented to [facility] with persistent fatigue, generalized weakness and abdominal pain.  Laboratory evaluation revealed hypercalcemia as well as elevated creatinine.  CT abdomen /pelvis revealed left inferior renal mass concerning for malignancy with extensive osseous lytic metastatic disease, pathologic fracture of L1 as well as bilateral rib fractures, numerous small pulmonary nodules concerning for metastatic disease, gallstones and diverticulosis.  Request now received for image guided bone lesion biopsy for further evaluation.  Past Medical History:  Diagnosis Date  . Glaucoma   . High blood pressure   . Hyperlipidemia   . Morbid obesity ([place])     Past Surgical History:  Procedure Laterality Date  . cataract surgery      Allergies: Patient has no known allergies.  Medications: Prior to Admission medications   Medication Sig Start Date End Date Taking? Authorizing Provider  atorvastatin (LIPITOR) 20 MG tablet Take 1 tablet (20 mg total) by mouth daily. 06/05/17  Yes Nafziger, Tommi Rumps, NP  COMBIGAN 0.2-0.5 % ophthalmic solution INSTILL ONE GTT IN OU Q 12 H 04/07/18  Yes [provider]  cyclobenzaprine (FLEXERIL) 10 MG tablet Take 1 tablet (10 mg total) by mouth 2 (two) times daily as needed for muscle spasms. 04/17/18  Yes Nafziger, Tommi Rumps, NP  ibuprofen (ADVIL,MOTRIN) 600 MG tablet Take 1 tablet (600 mg total) by mouth every 8 (eight) hours as needed. 04/17/18  Yes Nafziger, Tommi Rumps, NP  lisinopril (PRINIVIL,ZESTRIL)  40 MG tablet TAKE 1 TABLET(40 MG) BY MOUTH DAILY 09/24/17  Yes Nafziger, Tommi Rumps, NP  tamsulosin (FLOMAX) 0.4 MG CAPS capsule TAKE 1 CAPSULE BY MOUTH EVERY DAY 03/31/18  Yes Nafziger, Tommi Rumps, NP  TRAVATAN Z 0.004 % SOLN ophthalmic solution INSTILL 1 DROP INTO BOTH EYES IN THE EVENING 04/07/18  Yes [provider]     Family History  Problem Relation Age of Onset  . Cancer Mother        Larynx     Social History   Socioeconomic History  . Marital status: Single    Spouse name: Not on file  . Number of children: Not on file  . Years of education: Not on file  . Highest education level: Not on file  Occupational History  . Not on file  Social Needs  . Financial resource strain: Not on file  . Food insecurity:    Worry: Not on file    Inability: Not on file  . Transportation needs:    Medical: Not on file    Non-medical: Not on file  Tobacco Use  . Smoking status: Never Smoker  . Smokeless tobacco: Never Used  Substance and Sexual Activity  . Alcohol use: Yes    Alcohol/week: 0.0 oz  . Drug use: No  . Sexual activity: Not on file  Lifestyle  . Physical activity:    Days per week: Not on file    Minutes per session: Not on file  . Stress: Not on file  Relationships  . Social connections:    Talks on phone: Not on file    Gets together: Not on file  Attends religious service: Not on file    Active member of club or organization: Not on file    Attends meetings of clubs or organizations: Not on file    Relationship status: Not on file  Other Topics Concern  . Not on file  Social History Narrative   Works for [employer] married    Three children ( [place], [place], [place])      Review of Systems see above, currently denies fever, headache, chest pain, cough, nausea, vomiting or bleeding.  He also complains of back pain, some dyspnea with exertion.  Vital Signs: BP 139/76 (BP Location: Left Arm)   Pulse 95   Temp 97.9 F (36.6 C) (Oral)   Resp 20    Ht 6' (1.829 m)   Wt 290 lb (131.5 kg)   SpO2 92%   BMI 39.33 kg/m   Physical Exam patient awake, oriented to name and birthdate but not year or day of week.  Asking if he is in [place].  Chest with slightly diminished breath sounds bases.  Heart with regular rate and rhythm.  Abdomen obese, soft, positive bowel sounds, some mild gen tenderness to palpation; extremities with full range of motion, not significantly edematous  Imaging: Ct Abdomen Pelvis Wo Contrast  Result Date: 04/24/2018 CLINICAL DATA:  72 year old male with elevated calcium. Evaluate for metastatic disease. EXAM: CT ABDOMEN AND PELVIS WITHOUT CONTRAST TECHNIQUE: Multidetector CT imaging of the abdomen and pelvis was performed following the standard protocol without IV contrast. COMPARISON:  None. FINDINGS: Evaluation of this exam is limited in the absence of intravenous contrast. Lower chest: Innumerable bilateral pulmonary nodules in the visualized lung bases. Findings may be infectious in etiology, although metastatic disease is not excluded. Clinical correlation is recommended. There is mild eventration of the right hemidiaphragm with right lung base atelectasis/scarring. There is multi vessel coronary vascular calcification. No intra-abdominal free air or free fluid. Hepatobiliary: Mild diffuse fatty infiltration of the liver. No intrahepatic biliary duct dilatation. There is stone within the gallbladder. No pericholecystic fluid. Pancreas: Unremarkable. No pancreatic ductal dilatation or surrounding inflammatory changes. Spleen: Normal in size without focal abnormality. Adrenals/Urinary Tract: The adrenal glands are unremarkable. There is a 3.7 x 3.1 x 4.5 cm exophytic solid mass rising from the medial aspect of the lower pole of the left kidney. This mass is not characterized but likely represents malignancy/neoplasm versus less likely metastatic disease. Further characterization with MRI is recommended. A 2 cm low attenuating  lesion in the interpolar aspect of the right kidney is not well characterized but may represent a minimally complex cyst. There is no hydronephrosis on either side. The visualized ureters and urinary bladder appear unremarkable. Stomach/Bowel: There is no bowel obstruction or active inflammation. There is sigmoid diverticulosis without active inflammatory changes. Vascular/Lymphatic: Mild aortoiliac atherosclerotic disease. No portal venous gas. No adenopathy. Reproductive: The prostate and seminal vesicles are grossly unremarkable. Other: None Musculoskeletal: There is diffuse lytic metastatic disease throughout the spine. There is associated pathologic fracture of the superior endplate of the L1. There is focal area of lytic destruction involving the left iliac bone measuring approximately 1.3 x 3.0 cm. There is fracture of the lateral aspect of the right seventh rib. Fracture of the lateral aspect of the left ninth, eighth ribs. IMPRESSION: 1. 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 Further characterization with MRI recommended. 2. Extensive osseous lytic metastatic disease with associated pathologic compression fracture of the L1  as well as bilateral rib fractures. 3. Innumerable small pulmonary nodules most likely metastatic disease. 4. Cholelithiasis. 5. Colonic diverticulosis. No bowel obstruction or active inflammation. Electronically Signed   By: Anner Crete M.D.   On: 04/24/2018 05:11    Labs:  CBC: Recent Labs    06/05/17 0728 04/23/18 2141  WBC 8.8 7.7  HGB 14.7 11.3*  HCT 43.7 34.3*  PLT 389.0 271    COAGS: No results for input(s): INR, APTT in the last 8760 hours.  BMP: Recent Labs    04/23/18 2141 04/24/18 0642 04/24/18 0953 04/24/18 1357  NA 141 141 141 144  K 4.5 3.6 4.2 4.0  CL 105 108 108 109  CO2 23 23 22 25   GLUCOSE 103* 117* 110* 109*  BUN 46* 44* 41* 44*  CALCIUM >15.0* 14.1* 13.6* 13.3*  CREATININE 2.36*  2.24* 2.14* 2.15*  GFRNONAA 26* 28* 30* 30*  GFRAA 31* 33* 35* 34*    LIVER FUNCTION TESTS: Recent Labs    06/05/17 0728 04/24/18 0152  BILITOT 0.9 1.2  AST 12 36  ALT 15 32  ALKPHOS 95 175*  PROT 6.3 6.5  ALBUMIN 4.0 3.1*    TUMOR MARKERS: No results for input(s): AFPTM, CEA, CA199, CHROMGRNA in the last 8760 hours.  Assessment and Plan:  72 y.o. male with past medical history significant for glaucoma, hyperlipidemia, high blood pressure, morbid obesity, elevated PSA recent presented to [facility] with persistent fatigue, generalized weakness and abdominal pain.  Laboratory evaluation revealed hypercalcemia as well as elevated creatinine.  CT abdomen /pelvis revealed left inferior renal mass concerning for malignancy with extensive osseous lytic metastatic disease, pathologic fracture of L1 as well as bilateral rib fractures, numerous small pulmonary nodules concerning for metastatic disease, gallstones and diverticulosis.  Request now received for image guided bone lesion biopsy for further evaluation.  Imaging studies have been reviewed by Dr. Pascal Lux and left iliac bone lesion appears amenable to biopsy.Risks and benefits discussed with the patient/daughter Michael [name] including, but not limited to bleeding, infection, damage to adjacent structures or low yield requiring additional tests.  All of the patient's questions were answered, patient/daughter are agreeable to proceed . Consent signed. We will tentatively plan case for 5/6 or 5/7.  Heparin injections will need to be held before procedure.      Thank you for this interesting consult.  I greatly enjoyed meeting Michael [name] and look forward to participating in their care.  A copy of this report was sent to the requesting provider on this date.  Electronically Signed: D. Rowe Robert, PA-C 04/24/2018, 5:09 PM   I spent a total of 30 minutes  in face to face in clinical consultation, greater than 50% of  which was counseling/coordinating care for CT-guided biopsy of left iliac bone lesion

## 2018-04-24 NOTE — H&P (Signed)
History and Physical    Michael [name] JKD:[id number] DOB: 09-Jul-1946 DOA: 04/23/2018  PCP: Michael Peng, NP  Patient coming from: Home  I have personally briefly reviewed patient's old medical records in [place]  Chief Complaint: Generalized weakness  HPI: Michael [name] is a 72 y.o. male with medical history significant of HTN, Elevated PSA last year.  Patient presents to the ED with c/o fatigue and generalize weakness slowly progressive over the last several weeks.  Difficulty ambulating progressively worsening.  Ultimately had a fall "sliding off of couch" yesterday and was unable to get up off of the ground until his friend found him today and called EMS.  No LOC due to fall, no head trauma.  No recent fevers nor infections.   ED Course: Work up showed AKI with creat 2.3 up from 1.09.  Initial work up demonstrated significant hypercalcemia with Ca++ >15 and a corrected calcium of at least > 15.7.   Review of Systems: As per HPI otherwise 10 point review of systems negative.   Past Medical History:  Diagnosis Date  . Glaucoma   . High blood pressure   . Hyperlipidemia   . Morbid obesity ([place])     Past Surgical History:  Procedure Laterality Date  . cataract surgery       reports that he has never smoked. He has never used smokeless tobacco. He reports that he drinks alcohol. He reports that he does not use drugs.  No Known Allergies  Family History  Problem Relation Age of Onset  . Cancer Mother        Larynx      Prior to Admission medications   Medication Sig Start Date End Date Taking? Authorizing Provider  atorvastatin (LIPITOR) 20 MG tablet Take 1 tablet (20 mg total) by mouth daily. 06/05/17  Yes Nafziger, Tommi Rumps, NP  COMBIGAN 0.2-0.5 % ophthalmic solution INSTILL ONE GTT IN OU Q 12 H 04/07/18  Yes [provider]  cyclobenzaprine (FLEXERIL) 10 MG tablet Take 1 tablet (10 mg total) by mouth 2 (two) times daily as needed for muscle  spasms. 04/17/18  Yes Nafziger, Tommi Rumps, NP  ibuprofen (ADVIL,MOTRIN) 600 MG tablet Take 1 tablet (600 mg total) by mouth every 8 (eight) hours as needed. 04/17/18  Yes Nafziger, Tommi Rumps, NP  lisinopril (PRINIVIL,ZESTRIL) 40 MG tablet TAKE 1 TABLET(40 MG) BY MOUTH DAILY 09/24/17  Yes Nafziger, Tommi Rumps, NP  tamsulosin (FLOMAX) 0.4 MG CAPS capsule TAKE 1 CAPSULE BY MOUTH EVERY DAY 03/31/18  Yes Nafziger, Tommi Rumps, NP  TRAVATAN Z 0.004 % SOLN ophthalmic solution INSTILL 1 DROP INTO BOTH EYES IN THE EVENING 04/07/18  Yes [provider]    Physical Exam: Vitals:   04/24/18 0100 04/24/18 0130 04/24/18 0300 04/24/18 0330  BP: 127/73 131/61 135/71 (!) 153/92  Pulse: 80 79 79   Resp: 20 20 (!) 21 (!) 25  Temp:      TempSrc:      SpO2: 90% 95% 93%   Weight:      Height:        Constitutional: NAD, calm, comfortable Eyes: PERRL, lids and conjunctivae normal ENMT: Mucous membranes are moist. Posterior pharynx clear of any exudate or lesions.Normal dentition.  Neck: normal, supple, no masses, no thyromegaly Respiratory: clear to auscultation bilaterally, no wheezing, no crackles. Normal respiratory effort. No accessory muscle use.  Cardiovascular: Regular rate and rhythm, no murmurs / rubs / gallops. No extremity edema. 2+ pedal pulses. No carotid bruits.  Abdomen: no tenderness,  no masses palpated. No hepatosplenomegaly. Bowel sounds positive.  Musculoskeletal: no clubbing / cyanosis. No joint deformity upper and lower extremities. Good ROM, no contractures. Normal muscle tone.  Skin: no rashes, lesions, ulcers. No induration Neurologic: CN 2-12 grossly intact. Sensation intact, DTR normal. Strength 5/5 in all 4.  Psychiatric: Normal judgment and insight. Alert and oriented x 3. Normal mood.    Labs on Admission: I have personally reviewed following labs and imaging studies  CBC: Recent Labs  Lab 04/23/18 2141  WBC 7.7  HGB 11.3*  HCT 34.3*  MCV 90.0  PLT 235   Basic Metabolic Panel: Recent  Labs  Lab 04/23/18 2141  NA 141  K 4.5  CL 105  CO2 23  GLUCOSE 103*  BUN 46*  CREATININE 2.36*  CALCIUM >15.0*   GFR: Estimated Creatinine Clearance: 41.5 mL/min (A) (by C-G formula based on SCr of 2.36 mg/dL (H)). Liver Function Tests: Recent Labs  Lab 04/24/18 0152  AST 36  ALT 32  ALKPHOS 175*  BILITOT 1.2  PROT 6.5  ALBUMIN 3.1*   No results for input(s): LIPASE, AMYLASE in the last 168 hours. No results for input(s): AMMONIA in the last 168 hours. Coagulation Profile: No results for input(s): INR, PROTIME in the last 168 hours. Cardiac Enzymes: Recent Labs  Lab 04/23/18 2143  CKTOTAL 196   BNP (last 3 results) No results for input(s): PROBNP in the last 8760 hours. HbA1C: No results for input(s): HGBA1C in the last 72 hours. CBG: Recent Labs  Lab 04/23/18 2150  GLUCAP 98   Lipid Profile: No results for input(s): CHOL, HDL, LDLCALC, TRIG, CHOLHDL, LDLDIRECT in the last 72 hours. Thyroid Function Tests: No results for input(s): TSH, T4TOTAL, FREET4, T3FREE, THYROIDAB in the last 72 hours. Anemia Panel: No results for input(s): VITAMINB12, FOLATE, FERRITIN, TIBC, IRON, RETICCTPCT in the last 72 hours. Urine analysis:    Component Value Date/Time   BILIRUBINUR n 05/16/2016 1046   PROTEINUR n 05/16/2016 1046   UROBILINOGEN 0.2 05/16/2016 1046   NITRITE n 05/16/2016 1046   LEUKOCYTESUR Negative 05/16/2016 1046    Radiological Exams on Admission: No results found.  EKG: Independently reviewed.  Assessment/Plan Principal Problem:   Hypercalcemia Active Problems:   Essential hypertension   AKI (acute kidney injury) ([place])    1. Hypercalcemia - DDX malignancy vs hyperparathyroidism vs other. 1. Initial work up ordered including: 1. PTH 2. CT abd/pelvis w/o contrast given report of abd pain / low back pain, suspicion of malignancy. 2. Calcitonin ordered 3. Got pamidronate in ED 4. IVF: 1L bolus then 200 cc/hr for 5h then 150 cc/hr 5. BMP Q4H  ordered to start later this morning to assess calcitonin efficacy 6. Tele monitor 2. AKI - 1. Strict intake and output 2. IVF as above 3. Hold lisinopril 4. Monitor kidney function with serial BMPs 3. HTN - 1. Hold lisinopril 2. Continue flomax  DVT prophylaxis: Heparin [place] Code Status: Full Family Communication: No family in room Disposition Plan: Home after admit Consults called: None Admission status: Admit to inpatient   Etta Quill DO [facility] Pager [phone]  If 7AM-7PM, please contact day team taking care of patient [email or web address] Password [email or web address]  04/24/2018, 4:02 AM

## 2018-04-24 NOTE — Progress Notes (Signed)
PROGRESS NOTE    Michael [name]  XTK:[id number] DOB: May 21, 1946 DOA: 04/23/2018 PCP: Dorothyann Peng, NP  Outpatient Specialists:   Brief Narrative:  72 year old male with history of hypertension and increased PSA. Patient said he noticed fatigue, generalized weakness, difficultly ambulating, and abdominal pain starting 2 weeks ago and  progressively worsening over the last 2 weeks. He slid off the couch onto the ground and was unable to get up, friend found him and called EMS. Work up in ED showed Cr 2.3 and hypercalcemia with Ca++ >15. UA was negative. Patient was admitted on working diagnosis of hypercalcemia and acute kidney injury.   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 - PTH pending - Ca levels improving since start of calcitonin and pamidronate, continue calcitonin - change q4hrs BMPs to BID BMPs - continue oncology workup   Acute Kidney Injury - Cr trending downward - continue strict I's and O's - continue IVF's - continue holding lisinopril - continue Flomax  Hypertension: - holding lisinopril, BPs remaining stable   DVT prophylaxis: Heparin Code Status: Full Family Communication: None at bedside Disposition Plan: Discharge when clinically stable   Consultants:     Procedures:   None  Antimicrobials:   None   Subjective: Patient was somnolent and slightly confused when speaking with me. He continues to feel weak, especially in his legs, abdominal pain continues but is  improving since coming to the hospital.   Objective: Vitals:   04/24/18 0130 04/24/18 0300 04/24/18 0330 04/24/18 0450  BP: 131/61 135/71 (!) 153/92 139/76  Pulse: 79 79  95  Resp: 20 (!) 21 (!) 25 20  Temp:    97.9 F (36.6 C)  TempSrc:    Oral  SpO2: 95% 93%  92%  Weight:      Height:        Intake/Output Summary (Last 24 hours) at 04/24/2018 1115 Last data filed at 04/24/2018 1000 Gross per 24 hour  Intake 3498.33 ml  Output 225 ml  Net 3273.33 ml   Filed Weights   04/23/18 2045  Weight: 131.5 kg (290 lb)    Examination:  General exam: Appears calm and comfortable, in no acute distress.  Respiratory system: Decreased breath sounds. Respiratory effort normal.  Cardiovascular system: S1 & S2 heard, RRR. No JVD, murmurs, rubs, gallops or clicks. No peripheral extremity edema. Gastrointestinal system: Abdomen is nondistended, soft and non tender to palpation. Central nervous system: Oriented to person and time, not place. No focal neurological deficits. Extremities: Symmetric 5 x 5 power in bilateral upper extremities. 4/5 power in bilateral lower extremities.  Skin: No rashes, lesions or ulcers Psychiatry: Mood & affect appropriate.     Data Reviewed: I have personally reviewed following labs and imaging studies  CBC: Recent Labs  Lab 04/23/18 2141  WBC 7.7  HGB 11.3*  HCT 34.3*  MCV 90.0  PLT 992   Basic Metabolic Panel: Recent Labs  Lab 04/23/18 2141 04/24/18 0642 04/24/18 0953  NA 141 141 141  K 4.5 3.6 4.2  CL 105 108 108  CO2 23 23 22   GLUCOSE 103*  117* 110*  BUN 46* 44* 41*  CREATININE 2.36* 2.24* 2.14*  CALCIUM >15.0* 14.1* 13.6*   GFR: Estimated Creatinine Clearance: 45.7 mL/min (A) (by C-G formula based on SCr of 2.14 mg/dL (H)). Liver Function Tests: Recent Labs  Lab 04/24/18 0152  AST 36  ALT 32  ALKPHOS 175*  BILITOT 1.2  PROT 6.5  ALBUMIN 3.1*   No results for input(s): LIPASE, AMYLASE in the last 168 hours. No results for  input(s): AMMONIA in the last 168 hours. Coagulation Profile: No results for input(s): INR, PROTIME in the last 168 hours. Cardiac Enzymes: Recent Labs  Lab 04/23/18 2143  CKTOTAL 196   BNP (last 3 results) No results for input(s): PROBNP in the last 8760 hours. HbA1C: No results for input(s): HGBA1C in the last 72 hours. CBG: Recent Labs  Lab 04/23/18 2150  GLUCAP 98   Lipid Profile: No results for input(s): CHOL, HDL, LDLCALC, TRIG, CHOLHDL, LDLDIRECT in the last 72 hours. Thyroid Function Tests: No results for input(s): TSH, T4TOTAL, FREET4, T3FREE, THYROIDAB in the last 72 hours. Anemia Panel: No results for input(s): VITAMINB12, FOLATE, FERRITIN, TIBC, IRON, RETICCTPCT in the last 72 hours. Urine analysis:    Component Value Date/Time   BILIRUBINUR n 05/16/2016 1046   PROTEINUR n 05/16/2016 1046   UROBILINOGEN 0.2 05/16/2016 1046   NITRITE n 05/16/2016 1046   LEUKOCYTESUR Negative 05/16/2016 1046   Sepsis Labs: @LABRCNTIP (procalcitonin:4,lacticidven:4)  )No results found for this or any previous visit (from the past 240 hour(s)).       Radiology Studies: Ct Abdomen Pelvis Wo Contrast  Result Date: 04/24/2018 CLINICAL DATA:  72 year old male with elevated calcium. Evaluate for metastatic disease. EXAM: CT ABDOMEN AND PELVIS WITHOUT CONTRAST TECHNIQUE: Multidetector CT imaging of the abdomen and pelvis was performed following the standard protocol without IV contrast. COMPARISON:  None. FINDINGS: Evaluation of this exam is limited in the absence of intravenous contrast. Lower chest: Innumerable bilateral pulmonary nodules in the visualized lung bases. Findings may be infectious in etiology, although metastatic disease is not excluded. Clinical correlation is recommended. There is mild eventration of the right hemidiaphragm with right lung base atelectasis/scarring. There is multi vessel coronary vascular calcification. No intra-abdominal free air or free fluid.  Hepatobiliary: Mild diffuse fatty infiltration of the liver. No intrahepatic biliary duct dilatation. There is stone within the gallbladder. No pericholecystic fluid. Pancreas: Unremarkable. No pancreatic ductal dilatation or surrounding inflammatory changes. Spleen: Normal in size without focal abnormality. Adrenals/Urinary Tract: The adrenal glands are unremarkable. There is a 3.7 x 3.1 x 4.5 cm exophytic solid mass rising from the medial aspect of the lower pole of the left kidney. This mass is not characterized but likely represents malignancy/neoplasm versus less likely metastatic disease. Further characterization with MRI is recommended. A 2 cm low attenuating lesion in the interpolar aspect of the right kidney is not well characterized but may represent a minimally complex cyst. There is no hydronephrosis on either side. The visualized ureters and urinary bladder appear unremarkable. Stomach/Bowel: There is no bowel obstruction or active inflammation. There is sigmoid diverticulosis without active inflammatory changes. Vascular/Lymphatic: Mild aortoiliac atherosclerotic disease. No portal venous gas. No adenopathy. Reproductive: The prostate and seminal vesicles are grossly unremarkable. Other: None Musculoskeletal: There is diffuse lytic metastatic disease throughout the spine. There is associated pathologic fracture of the superior endplate of the L1. There is focal area of lytic destruction involving the left iliac bone measuring approximately 1.3 x 3.0 cm. There is fracture of the lateral  aspect of the right seventh rib. Fracture of the lateral aspect of the left ninth, eighth ribs. IMPRESSION: 1. 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 Further characterization with MRI recommended. 2. Extensive osseous lytic metastatic disease with associated pathologic compression fracture of the L1 as well as bilateral rib fractures. 3. Innumerable small pulmonary  nodules most likely metastatic disease. 4. Cholelithiasis. 5. Colonic diverticulosis. No bowel obstruction or active inflammation. Electronically Signed   By: Anner Crete M.D.   On: 04/24/2018 05:11        Scheduled Meds: . brimonidine  1 drop Both Eyes Q12H   And  . timolol  1 drop Both Eyes Q12H  . calcitonin  400 Units Subcutaneous BID  . heparin  5,000 Units Subcutaneous Q8H  . latanoprost  1 drop Both Eyes QHS  . tamsulosin  0.4 mg Oral Daily   Continuous Infusions: . sodium chloride 150 mL/hr at 04/24/18 0503     LOS: 0 days    Time spent: 25 min   Merlene Pulling, PA-S [facility] If 7PM-7AM, please contact night-coverage [email or web address] Password [email or web address] 04/24/2018, 11:15 AM

## 2018-04-24 NOTE — Plan of Care (Signed)

## 2018-04-25 ENCOUNTER — Inpatient Hospital Stay (HOSPITAL_COMMUNITY): Payer: Medicare HMO

## 2018-04-25 LAB — CBC WITH DIFFERENTIAL/PLATELET
BAND NEUTROPHILS: 3 %
Basophils Absolute: 0 10*3/uL (ref 0.0–0.1)
Basophils Relative: 0 %
Eosinophils Absolute: 0 10*3/uL (ref 0.0–0.7)
Eosinophils Relative: 1 %
HCT: 29.3 % — ABNORMAL LOW (ref 39.0–52.0)
Hemoglobin: 9.6 g/dL — ABNORMAL LOW (ref 13.0–17.0)
LYMPHS ABS: 0.5 10*3/uL — AB (ref 0.7–4.0)
Lymphocytes Relative: 11 %
MCH: 29.8 pg (ref 26.0–34.0)
MCHC: 32.8 g/dL (ref 30.0–36.0)
MCV: 91 fL (ref 78.0–100.0)
MYELOCYTES: 2 %
Monocytes Absolute: 0.2 10*3/uL (ref 0.1–1.0)
Monocytes Relative: 5 %
Neutro Abs: 4.1 10*3/uL (ref 1.7–7.7)
Neutrophils Relative %: 78 %
PLATELETS: 218 10*3/uL (ref 150–400)
RBC: 3.22 MIL/uL — ABNORMAL LOW (ref 4.22–5.81)
RDW: 13.7 % (ref 11.5–15.5)
WBC: 4.8 10*3/uL (ref 4.0–10.5)

## 2018-04-25 LAB — BASIC METABOLIC PANEL
ANION GAP: 9 (ref 5–15)
Anion gap: 11 (ref 5–15)
BUN: 43 mg/dL — AB (ref 6–20)
BUN: 40 mg/dL — AB (ref 6–20)
CO2: 20 mmol/L — ABNORMAL LOW (ref 22–32)
CO2: 22 mmol/L (ref 22–32)
CREATININE: 2.18 mg/dL — AB (ref 0.61–1.24)
CREATININE: 1.97 mg/dL — AB (ref 0.61–1.24)
Calcium: 11.9 mg/dL — ABNORMAL HIGH (ref 8.9–10.3)
Calcium: 10.5 mg/dL — ABNORMAL HIGH (ref 8.9–10.3)
Chloride: 110 mmol/L (ref 101–111)
Chloride: 114 mmol/L — ABNORMAL HIGH (ref 101–111)
GFR calc Af Amer: 34 mL/min — ABNORMAL LOW (ref >60)
GFR calc Af Amer: 38 mL/min — ABNORMAL LOW (ref >60)
GFR calc non Af Amer: 33 mL/min — ABNORMAL LOW (ref >60)
GFR, EST NON AFRICAN AMERICAN: 29 mL/min — AB (ref >60)
Glucose, Bld: 109 mg/dL — ABNORMAL HIGH (ref 65–99)
Glucose, Bld: 94 mg/dL (ref 65–99)
POTASSIUM: 3.9 mmol/L (ref 3.5–5.1)
POTASSIUM: 4.2 mmol/L (ref 3.5–5.1)
SODIUM: 141 mmol/L (ref 135–145)
Sodium: 145 mmol/L (ref 135–145)

## 2018-04-25 LAB — ALBUMIN: Albumin: 2.5 g/dL — ABNORMAL LOW (ref 3.5–5.0)

## 2018-04-25 LAB — URINALYSIS, ROUTINE W REFLEX MICROSCOPIC
BILIRUBIN URINE: NEGATIVE
GLUCOSE, UA: NEGATIVE mg/dL
Ketones, ur: NEGATIVE mg/dL
LEUKOCYTES UA: NEGATIVE
NITRITE: NEGATIVE
PH: 5 (ref 5.0–8.0)
Protein, ur: 30 mg/dL — AB
SPECIFIC GRAVITY, URINE: 1.013 (ref 1.005–1.030)

## 2018-04-25 LAB — CALCIUM, IONIZED: Calcium, Ionized, Serum: 8.9 mg/dL — ABNORMAL HIGH (ref 4.5–5.6)

## 2018-04-25 LAB — PTH, INTACT AND CALCIUM
Calcium, Total (PTH): 15.1 mg/dL (ref 8.6–10.2)
PTH: 6 pg/mL — AB (ref 15–65)

## 2018-04-25 MED ORDER — MORPHINE SULFATE (PF) 4 MG/ML IV SOLN
2.0000 mg | Freq: Once | INTRAVENOUS | Status: DC | PRN
  Filled 2018-04-25 (×2): qty 1

## 2018-04-25 MED ORDER — LORAZEPAM 2 MG/ML IJ SOLN
0.5000 mg | Freq: Once | INTRAMUSCULAR | Status: AC
  Administered 2018-04-25: 0.5 mg via INTRAVENOUS
  Filled 2018-04-25: qty 1

## 2018-04-25 MED ORDER — ZOLEDRONIC ACID 4 MG/5ML IV CONC: 4.0000 mg | Freq: Once | INTRAVENOUS | Status: DC

## 2018-04-25 MED ORDER — SODIUM CHLORIDE 0.9 % IV SOLN
30.0000 mg | Freq: Once | INTRAVENOUS | Status: AC
  Administered 2018-04-25: 30 mg via INTRAVENOUS
  Filled 2018-04-25: qty 10

## 2018-04-25 NOTE — Progress Notes (Signed)
Paged doctor to inform that lab called with a critical calcium level of 15.1

## 2018-04-25 NOTE — Progress Notes (Signed)
Paged doctor as pt is restless and pulling at all lines. Unable to keep calm. Pt attempting to leave bed. Awaiting response/new orders.

## 2018-04-25 NOTE — Progress Notes (Signed)
PROGRESS NOTE [facility]   Edahi Kroening   TKP:[id number] DOB: April 05, 1946  DOA: 04/23/2018 PCP: Dorothyann Peng, NP   Brief Narrative:  Michael [name] is a 72 year old male with medical history of hypertension, presented to the emergency department complaining of fatigue, generalized weakness and abdominal pain.  Symptoms started 2 weeks prior to admission.  Upon ED evaluation was found to have elevated creatinine at 2.3, and hypercalcemia at 15. >  Patient was admitted with working diagnosis of hypercalcemia and acute kidney injury.  CT of the abdomen was performed which show renal mass with osseous and pulmonary metastases. Concerning for malignancy   Subjective: Patient seen and examined, he seems a little more confused than yesterday.  However responsive and following commands.  Denies chest pain, abdominal pain and difficulty breathing.  Remains afebrile.  Calcium level trending down  Assessment & Plan: Hypercalcemia Likely secondary to malignancy, however differential diagnosis include multiple myeloma given lytic lesions and CT scan.  CT abdomen/pelvis shows solid mass arising from medial inferior left kidney, extensive osseous lytic metastatic lesion with pathology compression fracture of L1 as well as bilateral ribs.  Also numerous  pulmonary nodules likely representing metastases.  Suspect renal cell carcinoma. I have discussed case with urology who recommended bone biopsy to identify type of cancer.  Calcium trending down, continue calcitonin and IV fluid.  Low PTH consistent with primary hypercalcemia.  Will check PTHrP, vitamin D, follow-up SPEP. CT head does not showed any signs of metastasis or acute changes.  Acute renal failure Likely prerenal Continue IV fluids, avoid nephrotoxic agent Monitor INO's  Hypertension BP remains stable. Holding lisinopril due to AKI  DVT prophylaxis: Heparin SQ Code Status: Full code Family Communication: Case discussed with  daughter via phone call Disposition Plan: TBD  Consultants:   IR  Procedures:   None  Antimicrobials:  None   Objective: Vitals:   04/24/18 2356 04/25/18 0401 04/25/18 1216 04/25/18 1324  BP: 115/65 101/90 105/78 108/65  Pulse: (!) 103 88 83 78  Resp: (!) _0 Temp: 97.9 F (36.6 C) 99.5 F (37.5 C) 98 F (36.7 C) (!) 97.5 F (36.4 C)  TempSrc: Oral Oral Oral Oral  SpO2: 95% 94% 90% 96%  Weight:      Height:        Intake/Output Summary (Last 24 hours) at 04/25/2018 1514 Last data filed at 04/25/2018 1504 Gross per 24 hour  Intake 4247.5 ml  Output 1655 ml  Net 2592.5 ml   Filed Weights   04/23/18 2045  Weight: 131.5 kg (290 lb)    Examination:  General exam: NAD Respiratory system: Clear to auscultation. No wheezes,crackle or rhonchi Cardiovascular system: S1 & S2 heard, RRR. No JVD, murmurs, rubs or gallops Gastrointestinal system: Abdomen is nondistended, soft and nontender. Central nervous system: Oriented to person only.  No focal deficit Extremities: No pedal edema. Skin: No rashes, lesions or ulcers   Data Reviewed: I have personally reviewed following labs and imaging studies  CBC: Recent Labs  Lab 04/23/18 2141 04/25/18 0446  WBC 7.7 4.8  NEUTROABS  --  4.1  HGB 11.3* 9.6*  HCT 34.3* 29.3*  MCV 90.0 91.0  PLT 271 517   Basic Metabolic Panel: Recent Labs  Lab 04/24/18 0642 04/24/18 0953 04/24/18 1357 04/24/18 1740 04/25/18 0446  NA 141 141 144 145 141  K 3.6 4.2 4.0 3.9 3.9  CL 108 108 109 110 110  CO2 _1 20*  GLUCOSE 117* 110* 109* 106* 109*  BUN 44* 41* 44* 42* 43*  CREATININE 2.24* 2.14* 2.15* 2.15* 2.18*  CALCIUM 14.1* 13.6* 13.3* 13.2* 11.9*   GFR: Estimated Creatinine Clearance: 44.9 mL/min (A) (by C-G formula based on SCr of 2.18 mg/dL (H)). Liver Function Tests: Recent Labs  Lab 04/24/18 0152 04/25/18 0446  AST 36  --   ALT 32  --   ALKPHOS 175*  --   BILITOT 1.2  --   PROT 6.5  --     ALBUMIN 3.1* 2.5*   No results for input(s): LIPASE, AMYLASE in the last 168 hours. No results for input(s): AMMONIA in the last 168 hours. Coagulation Profile: No results for input(s): INR, PROTIME in the last 168 hours. Cardiac Enzymes: Recent Labs  Lab 04/23/18 2143  CKTOTAL 196   BNP (last 3 results) No results for input(s): PROBNP in the last 8760 hours. HbA1C: No results for input(s): HGBA1C in the last 72 hours. CBG: Recent Labs  Lab 04/23/18 2150  GLUCAP 98   Lipid Profile: No results for input(s): CHOL, HDL, LDLCALC, TRIG, CHOLHDL, LDLDIRECT in the last 72 hours. Thyroid Function Tests: No results for input(s): TSH, T4TOTAL, FREET4, T3FREE, THYROIDAB in the last 72 hours. Anemia Panel: No results for input(s): VITAMINB12, FOLATE, FERRITIN, TIBC, IRON, RETICCTPCT in the last 72 hours. Sepsis Labs: No results for input(s): PROCALCITON, LATICACIDVEN in the last 168 hours.  No results found for this or any previous visit (from the past 240 hour(s)).    Radiology Studies: Ct Abdomen Pelvis Wo Contrast  Result Date: 04/24/2018 CLINICAL DATA:  72 year old male with elevated calcium. Evaluate for metastatic disease. EXAM: CT ABDOMEN AND PELVIS WITHOUT CONTRAST TECHNIQUE: Multidetector CT imaging of the abdomen and pelvis was performed following the standard protocol without IV contrast. COMPARISON:  None. FINDINGS: Evaluation of this exam is limited in the absence of intravenous contrast. Lower chest: Innumerable bilateral pulmonary nodules in the visualized lung bases. Findings may be infectious in etiology, although metastatic disease is not excluded. Clinical correlation is recommended. There is mild eventration of the right hemidiaphragm with right lung base atelectasis/scarring. There is multi vessel coronary vascular calcification. No intra-abdominal free air or free fluid. Hepatobiliary: Mild diffuse fatty infiltration of the liver. No intrahepatic biliary duct  dilatation. There is stone within the gallbladder. No pericholecystic fluid. Pancreas: Unremarkable. No pancreatic ductal dilatation or surrounding inflammatory changes. Spleen: Normal in size without focal abnormality. Adrenals/Urinary Tract: The adrenal glands are unremarkable. There is a 3.7 x 3.1 x 4.5 cm exophytic solid mass rising from the medial aspect of the lower pole of the left kidney. This mass is not characterized but likely represents malignancy/neoplasm versus less likely metastatic disease. Further characterization with MRI is recommended. A 2 cm low attenuating lesion in the interpolar aspect of the right kidney is not well characterized but may represent a minimally complex cyst. There is no hydronephrosis on either side. The visualized ureters and urinary bladder appear unremarkable. Stomach/Bowel: There is no bowel obstruction or active inflammation. There is sigmoid diverticulosis without active inflammatory changes. Vascular/Lymphatic: Mild aortoiliac atherosclerotic disease. No portal venous gas. No adenopathy. Reproductive: The prostate and seminal vesicles are grossly unremarkable. Other: None Musculoskeletal: There is diffuse lytic metastatic disease throughout the spine. There is associated pathologic fracture of the superior endplate of the L1. There is focal area of lytic destruction involving the left iliac bone measuring approximately 1.3 x 3.0 cm. There is fracture of the lateral aspect of the right  seventh rib. Fracture of the lateral aspect of the left ninth, eighth ribs. IMPRESSION: 1. 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 Further characterization with MRI recommended. 2. Extensive osseous lytic metastatic disease with associated pathologic compression fracture of the L1 as well as bilateral rib fractures. 3. Innumerable small pulmonary nodules most likely metastatic disease. 4. Cholelithiasis. 5. Colonic diverticulosis. No bowel  obstruction or active inflammation. Electronically Signed   By: Anner Crete M.D.   On: 04/24/2018 05:11   Ct Head Wo Contrast  Result Date: 04/25/2018 CLINICAL DATA:  Altered mental status.  Agitation. EXAM: CT HEAD WITHOUT CONTRAST TECHNIQUE: Contiguous axial images were obtained from the base of the skull through the vertex without intravenous contrast. COMPARISON:  None. FINDINGS: Brain: Basal ganglia are intact bilaterally. The insular ribbon is normal. Hypoattenuation involving the right temporal tip is likely artifactual with some beam hardening artifact. The brainstem and cerebellum are normal. 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 No acute infarct, hemorrhage, or mass lesion is present. The ventricles are of normal size. No other significant extra-axial fluid collection is present. Vascular: No hyperdense vessel or unexpected calcification. Skull: Calvarium is intact. No focal lytic or blastic lesions are present. Sinuses/Orbits: The paranasal sinuses and mastoid air cells are clear. Left lens replacement is noted. Globes and orbits are otherwise within normal limits. IMPRESSION: 1. No acute or focal lesion to explain the patient's symptoms. 2. Mild atrophy and white matter disease. 3. 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 Electronically Signed   By: San Morelle M.D.   On: 04/25/2018 13:16    Scheduled Meds: . brimonidine  1 drop Both Eyes Q12H   And  . timolol  1 drop Both Eyes Q12H  . heparin  5,000 Units Subcutaneous Q8H  . latanoprost  1 drop Both Eyes QHS  . tamsulosin  0.4 mg Oral Daily   Continuous Infusions: . sodium chloride 150 mL/hr at 04/25/18 1351     LOS: 1 day    Time spent: Total of 25 minutes spent with pt, greater than 50% of which was spent in discussion of  treatment, counseling and coordination of care  Chipper Oman, MD Pager:  Text Page via [email or web address]   If 7PM-7AM, please contact night-coverage [email or web address] 04/25/2018, 3:14 PM   Note - This record has been created using [employer]. Chart creation errors have been sought, but may not always have been located. Such creation errors do not reflect on the standard of medical care.

## 2018-04-25 NOTE — Progress Notes (Signed)
Patient has pulled off condom cath twice in the past hour and his tele leads off five times. Patient restless and confused. Throwing gown, sheets and blankets on the floor. Oxygen has been removed multiple times. Paged doctor for PRN's as mitts has made patient more agitated. Awaiting response/new orders.

## 2018-04-26 LAB — BASIC METABOLIC PANEL
Anion gap: 8 (ref 5–15)
BUN: 33 mg/dL — ABNORMAL HIGH (ref 6–20)
CO2: 21 mmol/L — AB (ref 22–32)
Calcium: 9.5 mg/dL (ref 8.9–10.3)
Chloride: 112 mmol/L — ABNORMAL HIGH (ref 101–111)
Creatinine, Ser: 1.77 mg/dL — ABNORMAL HIGH (ref 0.61–1.24)
GFR calc non Af Amer: 37 mL/min — ABNORMAL LOW (ref >60)
GFR, EST AFRICAN AMERICAN: 43 mL/min — AB (ref >60)
Glucose, Bld: 103 mg/dL — ABNORMAL HIGH (ref 65–99)
POTASSIUM: 4 mmol/L (ref 3.5–5.1)
SODIUM: 141 mmol/L (ref 135–145)

## 2018-04-26 LAB — ALBUMIN: ALBUMIN: 2.4 g/dL — AB (ref 3.5–5.0)

## 2018-04-26 MED ORDER — DOCUSATE SODIUM 100 MG PO CAPS
200.0000 mg | ORAL_CAPSULE | Freq: Every day | ORAL | Status: DC | PRN
  Administered 2018-04-26 – 2018-04-27 (×3): 200 mg via ORAL
  Filled 2018-04-26 (×2): qty 2

## 2018-04-26 MED ORDER — ENSURE ENLIVE PO LIQD
237.0000 mL | Freq: Two times a day (BID) | ORAL | Status: DC
  Administered 2018-04-26 – 2018-05-01 (×9): 237 mL via ORAL

## 2018-04-26 NOTE — Progress Notes (Signed)
Pt is refused to eat meals, will take sips of Ensure throughout the day. Will cont to monitor. MD made aware. SRP, RN

## 2018-04-26 NOTE — Progress Notes (Signed)
Pt calm, alert and oriented, called staff several times with personal needs. Pt no longer is pulling at tubes and lines or oxygen. Sitter d/c, staffing office updated, order d/c from [facility], CN updated, will cont to monitor and  Update MD as needed. SRP, RN.

## 2018-04-26 NOTE — Progress Notes (Signed)
Pt c/o of constipation, MD made aware. SRP, RN

## 2018-04-26 NOTE — Progress Notes (Signed)
PROGRESS NOTE [facility]   Michael [name]   LTJ:[id number] DOB: July 29, 1946  DOA: 04/23/2018 PCP: Dorothyann Peng, NP   Brief Narrative:  Michael [name] is a 72 year old male with medical history of hypertension, presented to the emergency department complaining of fatigue, generalized weakness and abdominal pain.  Symptoms started 2 weeks prior to admission.  Upon ED evaluation was found to have elevated creatinine at 2.3, and hypercalcemia at 15. >  Patient was admitted with working diagnosis of hypercalcemia and acute kidney injury.  CT of the abdomen was performed which show renal mass with osseous and pulmonary metastases. Concerning for malignancy   Subjective: Patient seen and examined, he is alert and oriented and doing well.  Report to have persistent weakness.  Assessment & Plan: Hypercalcemia - corrected calcium 10.8 Likely secondary to malignancy, however differential diagnosis include multiple myeloma given lytic lesions and CT scan.  CT abdomen/pelvis shows solid mass arising from medial inferior left kidney, extensive osseous lytic metastatic lesion with pathology compression fracture of L1 as well as bilateral ribs.  Also numerous  pulmonary nodules likely representing metastases.  Suspect renal cell carcinoma. I have discussed case with urology who recommended bone biopsy to identify type of cancer.  Calcium trending down, treated with calcitonin and IV fluid..  Low PTH consistent with primary hypercalcemia.   PTHrP, vitamin D and SPEP pending. CT head does not showed any signs of metastasis or acute changes.  Patient will go for bone biopsy in a.m.  Recommend PT evaluation for disposition.  Acute renal failure Likely prerenal Creatinine improving with hydration Good diuresis, avoid hypotension and nephrotoxic agents Continue gentle hydration and monitor renal function in a.m.  Hypertension BP remains stable. Continue to hold lisinopril due to AKI  DVT  prophylaxis: Heparin SQ Code Status: Full code Family Communication: None at bedside Disposition Plan:   Consultants:   IR  Procedures:   None  Antimicrobials:  None   Objective: Vitals:   04/25/18 1817 04/25/18 2011 04/26/18 0011 04/26/18 0357  BP: 105/63 111/69 110/72 118/77  Pulse: 89 85 82 82  Resp: 20 20 (!) 24 (!) 24  Temp: 97.9 F (36.6 C) 98.3 F (36.8 C) 97.7 F (36.5 C) 98.2 F (36.8 C)  TempSrc: Oral Oral Oral Oral  SpO2: 97% 97% 98% 99%  Weight:      Height:        Intake/Output Summary (Last 24 hours) at 04/26/2018 1438 Last data filed at 04/26/2018 1311 Gross per 24 hour  Intake 4780 ml  Output 1551 ml  Net 3229 ml   Filed Weights   04/23/18 2045  Weight: 131.5 kg (290 lb)    Examination:  General: Pt is alert, awake, not in acute distress Cardiovascular: RRR, S1/S2 +, no rubs, no gallops Respiratory: CTA bilaterally, no wheezing, no rhonchi Abdominal: Soft, NT, ND, bowel sounds + Extremities: no edema, no cyanosis  Data Reviewed: I have personally reviewed following labs and imaging studies  CBC: Recent Labs  Lab 04/23/18 2141 04/25/18 0446  WBC 7.7 4.8  NEUTROABS  --  4.1  HGB 11.3* 9.6*  HCT 34.3* 29.3*  MCV 90.0 91.0  PLT 271 076   Basic Metabolic Panel: Recent Labs  Lab 04/24/18 1357 04/24/18 1740 04/25/18 0446 04/25/18 1644 04/26/18 0442  NA 144 145 141 145 141  K 4.0 3.9 3.9 4.2 4.0  CL 109 110 110 114* 112*  CO2 25 25 20* 22 21*  GLUCOSE 109* 106* 109* 94 103*  BUN 44* 42* 43* 40* 33*  CREATININE 2.15* 2.15* 2.18* 1.97* 1.77*  CALCIUM 13.3* 13.2* 11.9* 10.5* 9.5   GFR: Estimated Creatinine Clearance: 55.3 mL/min (A) (by C-G formula based on SCr of 1.77 mg/dL (H)). Liver Function Tests: Recent Labs  Lab 04/24/18 0152 04/25/18 0446 04/26/18 0442  AST 36  --   --   ALT 32  --   --   ALKPHOS 175*  --   --   BILITOT 1.2  --   --   PROT 6.5  --   --   ALBUMIN 3.1* 2.5* 2.4*   No results for input(s):  LIPASE, AMYLASE in the last 168 hours. No results for input(s): AMMONIA in the last 168 hours. Coagulation Profile: No results for input(s): INR, PROTIME in the last 168 hours. Cardiac Enzymes: Recent Labs  Lab 04/23/18 2143  CKTOTAL 196   BNP (last 3 results) No results for input(s): PROBNP in the last 8760 hours. HbA1C: No results for input(s): HGBA1C in the last 72 hours. CBG: Recent Labs  Lab 04/23/18 2150  GLUCAP 98   Lipid Profile: No results for input(s): CHOL, HDL, LDLCALC, TRIG, CHOLHDL, LDLDIRECT in the last 72 hours. Thyroid Function Tests: No results for input(s): TSH, T4TOTAL, FREET4, T3FREE, THYROIDAB in the last 72 hours. Anemia Panel: No results for input(s): VITAMINB12, FOLATE, FERRITIN, TIBC, IRON, RETICCTPCT in the last 72 hours. Sepsis Labs: No results for input(s): PROCALCITON, LATICACIDVEN in the last 168 hours.  No results found for this or any previous visit (from the past 240 hour(s)).    Radiology Studies: Ct Head Wo Contrast  Result Date: 04/25/2018 CLINICAL DATA:  Altered mental status.  Agitation. EXAM: CT HEAD WITHOUT CONTRAST TECHNIQUE: Contiguous axial images were obtained from the base of the skull through the vertex without intravenous contrast. COMPARISON:  None. FINDINGS: Brain: Basal ganglia are intact bilaterally. The insular ribbon is normal. Hypoattenuation involving the right temporal tip is likely artifactual with some beam hardening artifact. The brainstem and cerebellum are normal. 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 No acute infarct, hemorrhage, or mass lesion is present. The ventricles are of normal size. No other significant extra-axial fluid collection is present. Vascular: No hyperdense vessel or unexpected calcification. Skull: Calvarium is intact. No focal lytic or blastic lesions are present. Sinuses/Orbits: The paranasal sinuses and mastoid air cells are clear. Left lens  replacement is noted. Globes and orbits are otherwise within normal limits. IMPRESSION: 1. No acute or focal lesion to explain the patient's symptoms. 2. Mild atrophy and white matter disease. 3. 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 Electronically Signed   By: San Morelle M.D.   On: 04/25/2018 13:16    Scheduled Meds: . brimonidine  1 drop Both Eyes Q12H   And  . timolol  1 drop Both Eyes Q12H  . heparin  5,000 Units Subcutaneous Q8H  . latanoprost  1 drop Both Eyes QHS  . tamsulosin  0.4 mg Oral Daily   Continuous Infusions: . sodium chloride 150 mL/hr at 04/26/18 1013     LOS: 2 days    Time spent: Total of 25 minutes spent with pt, greater than 50% of which was spent in discussion of  treatment, counseling and coordination of care  Chipper Oman, MD Pager: Text Page via [email or web address]   If 7PM-7AM, please contact night-coverage [email or web address] 04/26/2018, 2:38 PM   Note -  This record has been created using [employer]. Chart creation errors have been sought, but may not always have been located. Such creation errors do not reflect on the standard of medical care.

## 2018-04-27 ENCOUNTER — Telehealth: Payer: Self-pay | Admitting: Adult Health

## 2018-04-27 LAB — BLOOD GAS, ARTERIAL
Acid-Base Excess: 0.3 mmol/L (ref 0.0–2.0)
Bicarbonate: 24.2 mmol/L (ref 20.0–28.0)
Drawn by: 51425
FIO2: 0.32
O2 Saturation: 95.6 %
PCO2 ART: 38.1 mmHg (ref 32.0–48.0)
PH ART: 7.418 (ref 7.350–7.450)
Patient temperature: 98.6
pO2, Arterial: 81.8 mmHg — ABNORMAL LOW (ref 83.0–108.0)

## 2018-04-27 LAB — PROTEIN ELECTROPHORESIS, SERUM
A/G Ratio: 1 (ref 0.7–1.7)
ALBUMIN ELP: 2.4 g/dL — AB (ref 2.9–4.4)
ALPHA-1-GLOBULIN: 0.3 g/dL (ref 0.0–0.4)
Alpha-2-Globulin: 0.9 g/dL (ref 0.4–1.0)
Beta Globulin: 0.8 g/dL (ref 0.7–1.3)
GAMMA GLOBULIN: 0.5 g/dL (ref 0.4–1.8)
Globulin, Total: 2.5 g/dL (ref 2.2–3.9)
M-Spike, %: 0.2 g/dL — ABNORMAL HIGH
TOTAL PROTEIN ELP: 4.9 g/dL — AB (ref 6.0–8.5)

## 2018-04-27 LAB — CBC WITH DIFFERENTIAL/PLATELET
Basophils Absolute: 0 10*3/uL (ref 0.0–0.1)
Basophils Relative: 1 %
EOS ABS: 0.2 10*3/uL (ref 0.0–0.7)
EOS PCT: 3 %
HCT: 32.5 % — ABNORMAL LOW (ref 39.0–52.0)
Hemoglobin: 10.6 g/dL — ABNORMAL LOW (ref 13.0–17.0)
LYMPHS ABS: 1.2 10*3/uL (ref 0.7–4.0)
Lymphocytes Relative: 19 %
MCH: 29.2 pg (ref 26.0–34.0)
MCHC: 32.6 g/dL (ref 30.0–36.0)
MCV: 89.5 fL (ref 78.0–100.0)
MONO ABS: 0.6 10*3/uL (ref 0.1–1.0)
MONOS PCT: 9 %
Neutro Abs: 4.1 10*3/uL (ref 1.7–7.7)
Neutrophils Relative %: 68 %
PLATELETS: 196 10*3/uL (ref 150–400)
RBC: 3.63 MIL/uL — AB (ref 4.22–5.81)
RDW: 13.6 % (ref 11.5–15.5)
WBC: 6 10*3/uL (ref 4.0–10.5)

## 2018-04-27 LAB — COMPREHENSIVE METABOLIC PANEL
ALT: 24 U/L (ref 17–63)
ANION GAP: 11 (ref 5–15)
AST: 27 U/L (ref 15–41)
Albumin: 2.6 g/dL — ABNORMAL LOW (ref 3.5–5.0)
Alkaline Phosphatase: 218 U/L — ABNORMAL HIGH (ref 38–126)
BILIRUBIN TOTAL: 0.7 mg/dL (ref 0.3–1.2)
BUN: 27 mg/dL — ABNORMAL HIGH (ref 6–20)
CHLORIDE: 110 mmol/L (ref 101–111)
CO2: 20 mmol/L — ABNORMAL LOW (ref 22–32)
Calcium: 9 mg/dL (ref 8.9–10.3)
Creatinine, Ser: 1.61 mg/dL — ABNORMAL HIGH (ref 0.61–1.24)
GFR calc non Af Amer: 42 mL/min — ABNORMAL LOW (ref >60)
GFR, EST AFRICAN AMERICAN: 49 mL/min — AB (ref >60)
Glucose, Bld: 98 mg/dL (ref 65–99)
POTASSIUM: 4.1 mmol/L (ref 3.5–5.1)
Sodium: 141 mmol/L (ref 135–145)
TOTAL PROTEIN: 5.6 g/dL — AB (ref 6.5–8.1)

## 2018-04-27 LAB — PROTIME-INR
INR: 1.12
PROTHROMBIN TIME: 14.3 s (ref 11.4–15.2)

## 2018-04-27 LAB — CALCITRIOL (1,25 DI-OH VIT D): Vit D, 1,25-Dihydroxy: <5 pg/mL — ABNORMAL LOW (ref 19.9–79.3)

## 2018-04-27 LAB — VITAMIN D 25 HYDROXY (VIT D DEFICIENCY, FRACTURES): Vit D, 25-Hydroxy: 14 ng/mL — ABNORMAL LOW (ref 30.0–100.0)

## 2018-04-27 MED ORDER — HALOPERIDOL LACTATE 5 MG/ML IJ SOLN
INTRAMUSCULAR | Status: AC
  Administered 2018-04-27: 5 mg
  Filled 2018-04-27: qty 1

## 2018-04-27 MED ORDER — HALOPERIDOL LACTATE 5 MG/ML IJ SOLN: 5.0000 mg | Freq: Once | INTRAMUSCULAR | Status: AC

## 2018-04-27 MED ORDER — BISACODYL 10 MG RE SUPP
10.0000 mg | Freq: Once | RECTAL | Status: AC
  Administered 2018-04-27: 10 mg via RECTAL

## 2018-04-27 MED ORDER — OXYCODONE HCL ER 10 MG PO T12A
10.0000 mg | EXTENDED_RELEASE_TABLET | Freq: Two times a day (BID) | ORAL | Status: DC
  Administered 2018-04-27 – 2018-04-28 (×2): 10 mg via ORAL
  Filled 2018-04-27 (×3): qty 1

## 2018-04-27 MED ORDER — POLYETHYLENE GLYCOL 3350 17 G PO PACK
17.0000 g | PACK | Freq: Every day | ORAL | Status: DC
  Administered 2018-04-27 – 2018-05-01 (×4): 17 g via ORAL
  Filled 2018-04-27 (×5): qty 1

## 2018-04-27 NOTE — Progress Notes (Signed)
PROGRESS NOTE [facility]   Kilian Schwartz   ZOX:[id number] DOB: 1946/01/12  DOA: 04/23/2018 PCP: Dorothyann Peng, NP   Brief Narrative:  Michael [name] is a 72 year old male with medical history of hypertension, presented to the emergency department complaining of fatigue, generalized weakness and abdominal pain.  Symptoms started 2 weeks prior to admission.  Upon ED evaluation was found to have elevated creatinine at 2.3, and hypercalcemia at 15. >  Patient was admitted with working diagnosis of hypercalcemia and acute kidney injury.  CT of the abdomen was performed which show renal mass with osseous and pulmonary metastases. Concerning for malignancy   Subjective: Patient seen and examined, today slight more confused. C/o back pain   Assessment & Plan: Hypercalcemia - corrected calcium 10.1 Likely secondary to malignancy, however differential diagnosis include multiple myeloma given lytic lesions and CT scan.  CT abdomen/pelvis shows solid mass arising from medial inferior left kidney, extensive osseous lytic metastatic lesion with pathology compression fracture of L1 as well as bilateral ribs.  Also numerous  pulmonary nodules likely representing metastases.  Suspect renal cell carcinoma. I have discussed case with urology who recommended bone biopsy to identify type of cancer.  Calcium trending down, treated with calcitonin and IV fluid. Low PTH consistent with primary hypercalcemia.   PTHrP, vitamin D and SPEP still pending. CT head does not showed any signs of metastasis or acute changes.  Patient was supposed to be for bone biopsy today, however receive Lovenox, procedure has been postponed for tomorrow morning.  PT evaluated and recommending SNF.  Acute metabolic encephalopathy Due to hypercalcemia of malignancy See above  Metastatic disease with pathological fracture of L1 ?  Renal cell carcinoma Increasing confusion could be related to uncontrolled pain. Pain  management with Oxy 10 mg evry 12 hrs and Norco for breakthrough pain See above  Acute renal failure Likely prerenal Cr continues to improve.  Hold IV fluids and encourage oral hydration Avoid hypotension nephrotoxic agents Monitor renal function in a.m.  Hypertension BP remains stable. Continue to hold lisinopril due to AKI  Arachnoid cyst - incidental  finding in CT scan. Discussed with neurology, monitor.  DVT prophylaxis: Heparin SQ Code Status: Full code Family Communication: None at bedside Disposition Plan: SNF in 2 to 3 days  Consultants:   IR  Procedures:   None  Antimicrobials:  None   Objective: Vitals:   04/26/18 0357 04/26/18 2139 04/27/18 0629 04/27/18 1222  BP: 118/77 (!) 127/102 (!) 144/91 (!) 145/92  Pulse: 82 87 97 96  Resp: (!) 24 19 (!) 21   Temp: 98.2 F (36.8 C) 98 F (36.7 C) 97.9 F (36.6 C) 98.7 F (37.1 C)  TempSrc: Oral  Oral Oral  SpO2: 99% 100% 92% 95%  Weight:      Height:        Intake/Output Summary (Last 24 hours) at 04/27/2018 1254 Last data filed at 04/27/2018 0857 Gross per 24 hour  Intake 4844.17 ml  Output 1000 ml  Net 3844.17 ml   Filed Weights   04/23/18 2045  Weight: 131.5 kg (290 lb)    Examination:  General: Slight confuse Cardiovascular: RRR, S1/S2 +, no rubs, no gallops Respiratory: CTA bilaterally, no wheezing, no rhonchi Abdominal: Soft, NT, ND, bowel sounds + Neuro: Oriented to person and time only, follow commands, no focal findings  Extremities: no edema  Data Reviewed: I have personally reviewed following labs and imaging studies  CBC: Recent Labs  Lab 04/23/18 2141 04/25/18 0446  04/27/18 0514  WBC 7.7 4.8 6.0  NEUTROABS  --  4.1 4.1  HGB 11.3* 9.6* 10.6*  HCT 34.3* 29.3* 32.5*  MCV 90.0 91.0 89.5  PLT 271 218 756   Basic Metabolic Panel: Recent Labs  Lab 04/24/18 1740 04/25/18 0446 04/25/18 1644 04/26/18 0442 04/27/18 0514  NA 145 141 145 141 141  K 3.9 3.9 4.2 4.0 4.1  CL  110 110 114* 112* 110  CO2 25 20* 22 21* 20*  GLUCOSE 106* 109* 94 103* 98  BUN 42* 43* 40* 33* 27*  CREATININE 2.15* 2.18* 1.97* 1.77* 1.61*  CALCIUM 13.2* 11.9* 10.5* 9.5 9.0   GFR: Estimated Creatinine Clearance: 60.8 mL/min (A) (by C-G formula based on SCr of 1.61 mg/dL (H)). Liver Function Tests: Recent Labs  Lab 04/24/18 0152 04/25/18 0446 04/26/18 0442 04/27/18 0514  AST 36  --   --  27  ALT 32  --   --  24  ALKPHOS 175*  --   --  218*  BILITOT 1.2  --   --  0.7  PROT 6.5  --   --  5.6*  ALBUMIN 3.1* 2.5* 2.4* 2.6*   No results for input(s): LIPASE, AMYLASE in the last 168 hours. No results for input(s): AMMONIA in the last 168 hours. Coagulation Profile: Recent Labs  Lab 04/27/18 0514  INR 1.12   Cardiac Enzymes: Recent Labs  Lab 04/23/18 2143  CKTOTAL 196   BNP (last 3 results) No results for input(s): PROBNP in the last 8760 hours. HbA1C: No results for input(s): HGBA1C in the last 72 hours. CBG: Recent Labs  Lab 04/23/18 2150  GLUCAP 98   Lipid Profile: No results for input(s): CHOL, HDL, LDLCALC, TRIG, CHOLHDL, LDLDIRECT in the last 72 hours. Thyroid Function Tests: No results for input(s): TSH, T4TOTAL, FREET4, T3FREE, THYROIDAB in the last 72 hours. Anemia Panel: No results for input(s): VITAMINB12, FOLATE, FERRITIN, TIBC, IRON, RETICCTPCT in the last 72 hours. Sepsis Labs: No results for input(s): PROCALCITON, LATICACIDVEN in the last 168 hours.  No results found for this or any previous visit (from the past 240 hour(s)).    Radiology Studies: No results found.  Scheduled Meds: . brimonidine  1 drop Both Eyes Q12H   And  . timolol  1 drop Both Eyes Q12H  . feeding supplement (ENSURE ENLIVE)  237 mL Oral BID BM  . latanoprost  1 drop Both Eyes QHS  . polyethylene glycol  17 g Oral Daily  . tamsulosin  0.4 mg Oral Daily   Continuous Infusions: . sodium chloride 50 mL/hr at 04/26/18 1843     LOS: 3 days    Time spent: Total of  25 minutes spent with pt, greater than 50% of which was spent in discussion of  treatment, counseling and coordination of care  Chipper Oman, MD Pager: Text Page via [email or web address]   If 7PM-7AM, please contact night-coverage [email or web address] 04/27/2018, 12:54 PM   Note - This record has been created using [employer]. Chart creation errors have been sought, but may not always have been located. Such creation errors do not reflect on the standard of medical care.

## 2018-04-27 NOTE — Progress Notes (Deleted)
Subjective:   Michael [name] is a 72 y.o. male who presents for an Initial Medicare Annual Wellness Visit.    Reports health as   Diet Chol/hdl 3; hdl 46; trig 82 A1c 5.2  BMI 40     Exercise  There are no preventive care reminders to display for this patient. Elevated 05/2017 5.88 2018  Colonoscopy 04/2018  Shingrix eduation   Objective:    There were no vitals filed for this visit. There is no height or weight on file to calculate BMI.  Advanced Directives 04/24/2018  Does Patient Have a Medical Advance Directive? Yes  Type of Paramedic of [place];Living will  Does patient want to make changes to medical advance directive? No - Patient declined  Copy of [name] in Chart? No - copy requested    Current Medications (verified) Facility-Administered Encounter Medications as of 04/28/2018  Medication  . acetaminophen (TYLENOL) tablet 650 mg   Or  . acetaminophen (TYLENOL) suppository 650 mg  . brimonidine (ALPHAGAN) 0.2 % ophthalmic solution 1 drop   And  . timolol (TIMOPTIC) 0.5 % ophthalmic solution 1 drop  . docusate sodium (COLACE) capsule 200 mg  . feeding supplement (ENSURE ENLIVE) (ENSURE ENLIVE) liquid 237 mL  . HYDROcodone-acetaminophen (NORCO/VICODIN) 5-325 MG per tablet 1-2 tablet  . latanoprost (XALATAN) 0.005 % ophthalmic solution 1 drop  . levalbuterol (XOPENEX) nebulizer solution 0.63 mg  . ondansetron (ZOFRAN) tablet 4 mg   Or  . ondansetron (ZOFRAN) injection 4 mg  . oxyCODONE (OXYCONTIN) 12 hr tablet 10 mg  . polyethylene glycol (MIRALAX / GLYCOLAX) packet 17 g  . tamsulosin (FLOMAX) capsule 0.4 mg   Outpatient Encounter Medications as of 04/28/2018  Medication Sig  . atorvastatin (LIPITOR) 20 MG tablet Take 1 tablet (20 mg total) by mouth daily.  . COMBIGAN 0.2-0.5 % ophthalmic solution INSTILL ONE GTT IN OU Q 12 H  . cyclobenzaprine (FLEXERIL) 10 MG tablet Take 1 tablet (10 mg total) by mouth 2  (two) times daily as needed for muscle spasms.  Marland Kitchen ibuprofen (ADVIL,MOTRIN) 600 MG tablet Take 1 tablet (600 mg total) by mouth every 8 (eight) hours as needed.  Marland Kitchen lisinopril (PRINIVIL,ZESTRIL) 40 MG tablet TAKE 1 TABLET(40 MG) BY MOUTH DAILY  . tamsulosin (FLOMAX) 0.4 MG CAPS capsule TAKE 1 CAPSULE BY MOUTH EVERY DAY  . TRAVATAN Z 0.004 % SOLN ophthalmic solution INSTILL 1 DROP INTO BOTH EYES IN THE EVENING    Allergies (verified) Patient has no known allergies.   History: Past Medical History:  Diagnosis Date  . Glaucoma   . High blood pressure   . Hyperlipidemia   . Morbid obesity ([place])    Past Surgical History:  Procedure Laterality Date  . cataract surgery     Family History  Problem Relation Age of Onset  . Cancer Mother        Larynx    Social History   Socioeconomic History  . Marital status: Single    Spouse name: Not on file  . Number of children: Not on file  . Years of education: Not on file  . Highest education level: Not on file  Occupational History  . Not on file  Social Needs  . Financial resource strain: Not on file  . Food insecurity:    Worry: Not on file    Inability: Not on file  . Transportation needs:    Medical: Not on file    Non-medical: Not on file  Tobacco Use  . Smoking status: Never Smoker  . Smokeless tobacco: Never Used  Substance and Sexual Activity  . Alcohol use: Yes    Alcohol/week: 0.0 oz  . Drug use: No  . Sexual activity: Not on file  Lifestyle  . Physical activity:    Days per week: Not on file    Minutes per session: Not on file  . Stress: Not on file  Relationships  . Social connections:    Talks on phone: Not on file    Gets together: Not on file    Attends religious service: Not on file    Active member of club or organization: Not on file    Attends meetings of clubs or organizations: Not on file    Relationship status: Not on file  Other Topics Concern  . Not on file  Social History Narrative   Works  for [employer]    Not married    Three children ( [place], [place], [place])    Tobacco Counseling Counseling given: Not Answered   Clinical Intake:  Activities of Daily Living In your present state of health, do you have any difficulty performing the following activities: 04/24/2018  Hearing? Y  Vision? Y  Difficulty concentrating or making decisions? Y  Walking or climbing stairs? Y  Dressing or bathing? Y  Doing errands, shopping? Y  Some recent data might be hidden     Immunizations and Health Maintenance Immunization History  Administered Date(s) Administered  . Pneumococcal Conjugate-13 05/09/2016  . Pneumococcal Polysaccharide-23 06/05/2017  . Tdap 03/30/2016   There are no preventive care reminders to display for this patient.  Patient Care Team: Dorothyann Peng, NP as PCP - General (Family Medicine)  Indicate any recent Medical Services you may have received from other than [facility] providers in the past year (date may be approximate).    Assessment:   This is a routine wellness examination for [name].  Hearing/Vision screen No exam data present  Dietary issues and exercise activities discussed:    Goals    None     Depression Screen No flowsheet data found.  Fall Risk No flowsheet data found.   Cognitive Function:   Ad8 score reviewed for issues:  Issues making decisions:  Less interest in hobbies / activities:  Repeats questions, stories (family complaining):  Trouble using ordinary gadgets (microwave, computer, phone):  Forgets the month or year:   Mismanaging finances:   Remembering appts:  Daily problems with thinking and/or memory: Ad8 score is=          Screening Tests Health Maintenance  Topic Date Due  . COLONOSCOPY  05/18/2018  . INFLUENZA VACCINE  07/23/2018  . TETANUS/TDAP  03/30/2026  . Hepatitis C Screening  Completed  . PNA vac Low Risk Adult  Completed         Plan:      PCP Notes ***  Health  Maintenance ***  Abnormal Screens  ***  Referrals  ***  Patient concerns; ***  Nurse Concerns; ***  Next PCP apt ***      I have personally reviewed and noted the following in the patient's chart:   . Medical and social history . Use of alcohol, tobacco or illicit drugs  . Current medications and supplements . Functional ability and status . Nutritional status . Physical activity . Advanced directives . List of other physicians . Hospitalizations, surgeries, and ER visits in previous 12 months . Vitals . Screenings to include cognitive, depression, and  falls . Referrals and appointments  In addition, I have reviewed and discussed with patient certain preventive protocols, quality metrics, and best practice recommendations. A written personalized care plan for preventive services as well as general preventive health recommendations were provided to patient.     Wynetta Fines, RN   04/27/2018

## 2018-04-27 NOTE — Evaluation (Signed)
Physical Therapy Evaluation Patient Details Name: Kayshawn Ozburn MRN: [id number] DOB: 1946/06/01 Today's Date: 04/27/2018   History of Present Illness  72 yo male admitted with hypercalcemia. CT + renal mass with extensive osseus mets, rib fxs, pathological L1 fx. Hx of morbid obesity  Clinical Impression  On eval, pt required Mod assist +2 for mobility. He was able to stand x 2 and take lateral steps along side of bed with a RW. Pt presents with general weakness, decreased activity tolerance, and impaired gait and balance. He is confused and has poor safety awareness. No family present during eval. Will follow and progress activity as tolerated. Recommend SNF.     Follow Up Recommendations SNF    Equipment Recommendations  None recommended by PT    Recommendations for Other Services       Precautions / Restrictions Precautions Precautions: Fall Restrictions Weight Bearing Restrictions: No      Mobility  Bed Mobility Overal bed mobility: Needs Assistance Bed Mobility: Supine to Sit;Sit to Supine     Supine to sit: Min guard;HOB elevated Sit to supine: Min guard;HOB elevated   General bed mobility comments: close guard for safety. increased time. pt relied on bedrail  Transfers Overall transfer level: Needs assistance Equipment used: Rolling walker (2 wheeled) Transfers: Sit to/from Stand Sit to Stand: From elevated surface;Mod assist;+2 physical assistance;+2 safety/equipment         General transfer comment: x2. Assist to rise, stabilize, control descent. Multimodal cueing required for safety, technique. Increased assistance required on 2nd stand.   Ambulation/Gait Ambulation/Gait assistance: Mod assist;+2 physical assistance;+2 safety/equipment   Assistive device: Rolling walker (2 wheeled)       General Gait Details: side steps along side of bed with a RW. Assist to stabilize pt and maneuver safely with RW. Pt fatigues quickly/easily. Pt c/o LEs feeling  weak-noted stability with activity.   Stairs            Wheelchair Mobility    Modified Rankin (Stroke Patients Only)       Balance Overall balance assessment: Needs assistance;History of Falls         Standing balance support: Bilateral upper extremity supported Standing balance-Leahy Scale: Poor                               Pertinent Vitals/Pain Pain Assessment: Faces Faces Pain Scale: Hurts little more Pain Location: abdomen and low back Pain Descriptors / Indicators: Discomfort;Aching Pain Intervention(s): Monitored during session;Repositioned;Limited activity within patient's tolerance    Home Living Family/patient expects to be discharged to:: Unsure Living Arrangements: Alone   Type of Home: House Home Access: Stairs to enter Entrance Stairs-Rails: Right Entrance Stairs-Number of Steps: 4 Home Layout: Bed/bath upstairs;Multi-level Home Equipment: None      Prior Function Level of Independence: Needs assistance         Comments: has a housekeeper 1x/week     Journalist, newspaper        Extremity/Trunk Assessment   Upper Extremity Assessment Upper Extremity Assessment: Generalized weakness    Lower Extremity Assessment Lower Extremity Assessment: Generalized weakness    Cervical / Trunk Assessment Cervical / Trunk Assessment: Normal  Communication      Cognition Arousal/Alertness: Awake/alert Behavior During Therapy: WFL for tasks assessed/performed Overall Cognitive Status: No family/caregiver present to determine baseline cognitive functioning Area of Impairment: Orientation;Problem solving;Safety/judgement;Memory  Orientation Level: Disoriented to;Place;Time;Situation   Memory: Decreased short-term memory   Safety/Judgement: Decreased awareness of deficits;Decreased awareness of safety   Problem Solving: Requires verbal cues;Requires tactile cues        General Comments      Exercises      Assessment/Plan    PT Assessment Patient needs continued PT services  PT Problem List Decreased strength;Decreased balance;Decreased mobility;Decreased activity tolerance;Decreased cognition;Decreased safety awareness;Decreased knowledge of use of DME;Obesity       PT Treatment Interventions DME instruction;Gait training;Functional mobility training;Therapeutic activities;Balance training;Patient/family education;Therapeutic exercise    PT Goals (Current goals can be found in the Care Plan section)  Acute Rehab PT Goals Patient Stated Goal: to get better PT Goal Formulation: With patient Time For Goal Achievement: 05/11/18 Potential to Achieve Goals: Good    Frequency Min 2X/week   Barriers to discharge        Co-evaluation               AM-PAC PT "6 Clicks" Daily Activity  Outcome Measure Difficulty turning over in bed (including adjusting bedclothes, sheets and blankets)?: A Lot Difficulty moving from lying on back to sitting on the side of the bed? : A Lot Difficulty sitting down on and standing up from a chair with arms (e.g., wheelchair, bedside commode, etc,.)?: Unable Help needed moving to and from a bed to chair (including a wheelchair)?: A Lot Help needed walking in hospital room?: A Lot Help needed climbing 3-5 steps with a railing? : Total 6 Click Score: 10    End of Session Equipment Utilized During Treatment: Gait belt Activity Tolerance: Patient limited by fatigue Patient left: in bed;with call bell/phone within reach;with bed alarm set   PT Visit Diagnosis: Muscle weakness (generalized) (M62.81);Difficulty in walking, not elsewhere classified (R26.2);History of falling (Z91.81)    Time: 9470-9628 PT Time Calculation (min) (ACUTE ONLY): 17 min   Charges:   PT Evaluation $PT Eval Moderate Complexity: 1 Mod     PT G Codes:          Weston Anna, MPT Pager: [phone]

## 2018-04-27 NOTE — Telephone Encounter (Signed)
Copied from [place] [id number]. Topic: Quick Communication - See Telephone Encounter >> Apr 27, 2018  2:25 PM Synthia Innocent wrote: CRM for notification. See Telephone encounter for: 04/27/18. Requesting to speak with [facility] regarding a medical issues, would like not elaborate

## 2018-04-28 ENCOUNTER — Inpatient Hospital Stay (HOSPITAL_COMMUNITY): Payer: Medicare HMO

## 2018-04-28 ENCOUNTER — Encounter (HOSPITAL_COMMUNITY): Payer: Self-pay | Admitting: Radiology

## 2018-04-28 ENCOUNTER — Ambulatory Visit: Payer: Medicare HMO

## 2018-04-28 ENCOUNTER — Ambulatory Visit: Payer: Medicare HMO | Admitting: Adult Health

## 2018-04-28 LAB — URINALYSIS, ROUTINE W REFLEX MICROSCOPIC
BILIRUBIN URINE: NEGATIVE
Glucose, UA: NEGATIVE mg/dL
HGB URINE DIPSTICK: NEGATIVE
Ketones, ur: NEGATIVE mg/dL
Leukocytes, UA: NEGATIVE
Nitrite: NEGATIVE
PROTEIN: NEGATIVE mg/dL
Specific Gravity, Urine: 1.016 (ref 1.005–1.030)
pH: 5 (ref 5.0–8.0)

## 2018-04-28 LAB — MAGNESIUM: Magnesium: 1.4 mg/dL — ABNORMAL LOW (ref 1.7–2.4)

## 2018-04-28 LAB — CBC
HCT: 30.3 % — ABNORMAL LOW (ref 39.0–52.0)
Hemoglobin: 10 g/dL — ABNORMAL LOW (ref 13.0–17.0)
MCH: 29.2 pg (ref 26.0–34.0)
MCHC: 33 g/dL (ref 30.0–36.0)
MCV: 88.6 fL (ref 78.0–100.0)
PLATELETS: 188 10*3/uL (ref 150–400)
RBC: 3.42 MIL/uL — ABNORMAL LOW (ref 4.22–5.81)
RDW: 13.7 % (ref 11.5–15.5)
WBC: 5.8 10*3/uL (ref 4.0–10.5)

## 2018-04-28 LAB — BASIC METABOLIC PANEL
ANION GAP: 11 (ref 5–15)
BUN: 25 mg/dL — AB (ref 6–20)
CALCIUM: 8.4 mg/dL — AB (ref 8.9–10.3)
CO2: 20 mmol/L — ABNORMAL LOW (ref 22–32)
Chloride: 110 mmol/L (ref 101–111)
Creatinine, Ser: 1.47 mg/dL — ABNORMAL HIGH (ref 0.61–1.24)
GFR calc Af Amer: 54 mL/min — ABNORMAL LOW (ref >60)
GFR calc non Af Amer: 47 mL/min — ABNORMAL LOW (ref >60)
GLUCOSE: 104 mg/dL — AB (ref 65–99)
Potassium: 3.7 mmol/L (ref 3.5–5.1)
Sodium: 141 mmol/L (ref 135–145)

## 2018-04-28 LAB — ALBUMIN
Albumin: 2.4 g/dL — ABNORMAL LOW (ref 3.5–5.0)
Albumin: 2.5 g/dL — ABNORMAL LOW (ref 3.5–5.0)

## 2018-04-28 LAB — PROCALCITONIN: PROCALCITONIN: 2.63 ng/mL

## 2018-04-28 MED ORDER — FUROSEMIDE 10 MG/ML IJ SOLN
40.0000 mg | Freq: Once | INTRAMUSCULAR | Status: AC
  Administered 2018-04-28: 40 mg via INTRAVENOUS
  Filled 2018-04-28: qty 4

## 2018-04-28 MED ORDER — FENTANYL CITRATE (PF) 100 MCG/2ML IJ SOLN
INTRAMUSCULAR | Status: AC | PRN
  Administered 2018-04-28: 50 ug via INTRAVENOUS
  Administered 2018-04-28: 25 ug via INTRAVENOUS

## 2018-04-28 MED ORDER — MIDAZOLAM HCL 2 MG/2ML IJ SOLN
INTRAMUSCULAR | Status: AC | PRN
  Administered 2018-04-28: 0.5 mg via INTRAVENOUS
  Administered 2018-04-28: 1 mg via INTRAVENOUS

## 2018-04-28 MED ORDER — FENTANYL CITRATE (PF) 100 MCG/2ML IJ SOLN
INTRAMUSCULAR | Status: AC
  Filled 2018-04-28: qty 2

## 2018-04-28 MED ORDER — MAGNESIUM SULFATE 2 GM/50ML IV SOLN
2.0000 g | Freq: Once | INTRAVENOUS | Status: AC
Start: 2018-04-28 — End: 2018-04-28
  Administered 2018-04-28: 2 g via INTRAVENOUS
  Filled 2018-04-28: qty 50

## 2018-04-28 MED ORDER — HEPARIN SODIUM (PORCINE) 5000 UNIT/ML IJ SOLN
5000.0000 [IU] | Freq: Three times a day (TID) | INTRAMUSCULAR | Status: DC
  Administered 2018-04-28 – 2018-05-01 (×9): 5000 [IU] via SUBCUTANEOUS
  Filled 2018-04-28 (×6): qty 1

## 2018-04-28 MED ORDER — LIDOCAINE HCL 1 % IJ SOLN
INTRAMUSCULAR | Status: AC | PRN
  Administered 2018-04-28: 10 mL via INTRADERMAL

## 2018-04-28 MED ORDER — AMLODIPINE BESYLATE 10 MG PO TABS
10.0000 mg | ORAL_TABLET | Freq: Every day | ORAL | Status: DC
  Administered 2018-04-28 – 2018-05-01 (×4): 10 mg via ORAL
  Filled 2018-04-28 (×4): qty 1

## 2018-04-28 MED ORDER — MIDAZOLAM HCL 2 MG/2ML IJ SOLN
INTRAMUSCULAR | Status: AC
  Filled 2018-04-28: qty 4

## 2018-04-28 NOTE — Progress Notes (Signed)
MEDICATION-RELATED CONSULT NOTE   IR Procedure Consult - Anticoagulant/Antiplatelet PTA/Inpatient Med List Review by Pharmacist    Procedure: bone biopsy    Completed: 5/7 0906  Post-Procedural bleeding risk per IR MD assessment:    Antithrombotic medications on inpatient or PTA profile prior to procedure:   SQ heparin 5000 units q8    Recommended restart time per IR Post-Procedure Guidelines:     Other considerations:      Plan:     Restart SQ heparin at [address] PharmD, BCPS Pager [phone] 04/28/2018 9:23 AM

## 2018-04-28 NOTE — Progress Notes (Signed)
PROGRESS NOTE [facility]   Michael [name]   VZD:[id number] DOB: [birth year]  DOA: 04/23/2018 PCP: Dorothyann Peng, NP   Brief Narrative:  Michael [name] is a 72 year old male with medical history of hypertension, presented to the emergency department complaining of fatigue, generalized weakness and abdominal pain.  Symptoms started 2 weeks prior to admission.  Upon ED evaluation was found to have elevated creatinine at 2.3, and hypercalcemia at 15. >  Patient was admitted with working diagnosis of hypercalcemia and acute kidney injury.  CT of the abdomen was performed which show renal mass with osseous and pulmonary metastases. Concerning for malignancy   Subjective: Patient seen and examined, he continues to wax and wane with his mental status.  He is a status post CT-guided bone lesion biopsy of the left iliac bone.  Assessment & Plan: Hypercalcemia - corrected calcium 9.7 Likely secondary to malignancy, however differential diagnosis include multiple myeloma given lytic lesions and CT scan.  CT abdomen/pelvis shows solid mass arising from medial inferior left kidney, extensive osseous lytic metastatic lesion with pathology compression fracture of L1 as well as bilateral ribs.  Also numerous  pulmonary nodules likely representing metastases.  Suspect renal cell carcinoma. I have discussed case with urology who recommended bone biopsy to identify type of cancer.  Calcium normalized, treated with calcitonin and IV fluid. Low PTH consistent with primary hypercalcemia. Vit D low, SPEP. 0.2%. Case discussed with oncology less likely that this is multiple myeloma. Recommending CT chest with contrast, however with AKI will wait for now. S/p CT-guided bone lesion biopsy of the left iliac bone.  Acute metabolic encephalopathy Due to hypercalcemia of malignancy, continues to wax and wane, dicussed with neuro, mentation can take several weeks to return back to normal with hyperCa+  D/c  schedule oxy, keep PRN  Will check UA  See above  Fluid overload  Due to aggressive hydration  CXR shows increase in vascular congestion/intertitial edema with R basilar atelectasis vs PNA.  Will give Lasix 40 mg IV x 1. 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  Renal cell carcinoma Increasing confusion could be related to uncontrolled pain. Pain management with Oxy 10 mg evry 12 hrs and Norco for breakthrough pain See above  Acute renal failure Likely prerenal Cr continues to improve Check renal function closely as patient is receiving IV Lasix  ? M spike   Hypertension BP trending up today  Given AKI will start Amlodipine for now, hold ACE due to AKI  Monitor BP closely   Arachnoid cyst - incidental  finding in CT scan. Discussed with neurology, monitor.  DVT prophylaxis: Heparin SQ Code Status: Full code Family Communication: None at bedside Disposition Plan: SNF in 1-2 if stable  Consultants:   IR  Procedures:   None  Antimicrobials:  None   Objective: Vitals:   04/28/18 1125 04/28/18 1216 04/28/18 1316 04/28/18 1358  BP: (!) 154/82 (!) 160/96  (!) 168/92  Pulse: (!) 102 96  100  Resp: (!) 28 (!) 32  (!) 36  Temp:      TempSrc:      SpO2: 94% 96% 94% 94%  Weight:      Height:        Intake/Output Summary (Last 24 hours) at 04/28/2018 1500 Last data filed at 04/28/2018 1402 Gross per 24 hour  Intake 1300 ml  Output 2150 ml  Net -850 ml  Filed Weights   05-01-18 2045  Weight: 131.5 kg (290 lb)    Examination:  General: NAD  Cardiovascular: RRR S1S2  Respiratory: Decrease BS b/l, bibasilar crackles R>L  Abdominal: Soft, NT, ND, bowel sounds + Neuro: Orient to person and place, follow commands, intermittently confused  Extremities: no edema, no cyanosis   Data Reviewed: I have personally reviewed following labs and imaging studies  CBC: Recent Labs    Lab 05/01/18 2141 04/25/18 0446 04/27/18 0514 06-May-2018 0503  WBC 7.7 4.8 6.0 5.8  NEUTROABS  --  4.1 4.1  --   HGB 11.3* 9.6* 10.6* 10.0*  HCT 34.3* 29.3* 32.5* 30.3*  MCV 90.0 91.0 89.5 88.6  PLT 271 218 196 563   Basic Metabolic Panel: Recent Labs  Lab 04/25/18 0446 04/25/18 1644 04/26/18 0442 04/27/18 0514 05-06-18 0509  NA 141 145 141 141 141  K 3.9 4.2 4.0 4.1 3.7  CL 110 114* 112* 110 110  CO2 20* 22 21* 20* 20*  GLUCOSE 109* 94 103* 98 104*  BUN 43* 40* 33* 27* 25*  CREATININE 2.18* 1.97* 1.77* 1.61* 1.47*  CALCIUM 11.9* 10.5* 9.5 9.0 8.4*  MG  --   --   --   --  1.4*   GFR: Estimated Creatinine Clearance: 66.5 mL/min (A) (by C-G formula based on SCr of 1.47 mg/dL (H)). Liver Function Tests: Recent Labs  Lab 04/24/18 0152 04/25/18 0446 04/26/18 0442 04/27/18 0514 May 06, 2018 0509  AST 36  --   --  27  --   ALT 32  --   --  24  --   ALKPHOS 175*  --   --  218*  --   BILITOT 1.2  --   --  0.7  --   PROT 6.5  --   --  5.6*  --   ALBUMIN 3.1* 2.5* 2.4* 2.6* 2.4*   No results for input(s): LIPASE, AMYLASE in the last 168 hours. No results for input(s): AMMONIA in the last 168 hours. Coagulation Profile: Recent Labs  Lab 04/27/18 0514  INR 1.12   Cardiac Enzymes: Recent Labs  Lab May 01, 2018 2143  CKTOTAL 196   BNP (last 3 results) No results for input(s): PROBNP in the last 8760 hours. HbA1C: No results for input(s): HGBA1C in the last 72 hours. CBG: Recent Labs  Lab 05/01/2018 2150  GLUCAP 98   Lipid Profile: No results for input(s): CHOL, HDL, LDLCALC, TRIG, CHOLHDL, LDLDIRECT in the last 72 hours. Thyroid Function Tests: No results for input(s): TSH, T4TOTAL, FREET4, T3FREE, THYROIDAB in the last 72 hours. Anemia Panel: No results for input(s): VITAMINB12, FOLATE, FERRITIN, TIBC, IRON, RETICCTPCT in the last 72 hours. Sepsis Labs: No results for input(s): PROCALCITON, LATICACIDVEN in the last 168 hours.  No results found for this or any  previous visit (from the past 240 hour(s)).    Radiology Studies: Ct Biopsy  Result Date: May 06, 2018 INDICATION: 72 year old male with a history of metastatic bone lesions. He has been referred for a targeted bone lesion biopsy EXAM: CT BIOPSY MEDICATIONS: None. ANESTHESIA/SEDATION: Moderate (conscious) sedation was employed during this procedure. A total of Versed 1.5 mg and Fentanyl 75 mcg was administered intravenously. Moderate Sedation Time: 14 minutes. The patient's level of consciousness and vital signs were monitored continuously by radiology nursing throughout the procedure under my direct supervision. FLUOROSCOPY TIME:  CT COMPLICATIONS: None PROCEDURE: The procedure risks, benefits, and alternatives were explained to the patient. Questions regarding the procedure were encouraged and answered. The patient  understands and consents to the procedure. Scout CT of the pelvis was performed for surgical planning purposes. The posterior pelvis was prepped with chlorhexidinein a sterile fashion, and a sterile drape was applied covering the operative field. A sterile gown and sterile gloves were used for the procedure. Local anesthesia was provided with 1% Lidocaine. We targeted the lesion in the posterior left iliac bone for biopsy. The skin and subcutaneous tissues were infiltrated with 1% lidocaine without epinephrine. A small stab incision was made with an 11 blade scalpel, and an 11 gauge Murphy needle was advanced with CT guidance to the posterior cortex. Manual forced was used to advance the needle through the posterior cortex and the stylet was removed. Once the needle was confirmed just proximal to the soft tissue lesion of the left iliac bone. A coaxial 17 gauge needle was advanced. Multiple 18 gauge core biopsy were then achieved and placed into formalin. The coaxial needle was then removed, and 11 gauge core biopsy was performed of the soft tissue lesion/lytic lesion with the [facility] needle. Manual  pressure was used for hemostasis and a sterile dressing was placed. No complications were encountered no significant blood loss was encountered. Patient tolerated the procedure well and remained hemodynamically stable throughout. IMPRESSION: Status post targeted bone lesion biopsy of the left posterior iliac bone. Signed, Dulcy Fanny. Earleen Newport, DO [facility] Electronically Signed   By: Corrie Mckusick D.O.   On: 04/28/2018 09:41   Dg Chest Port 1 View  Result Date: 04/28/2018 CLINICAL DATA:  Shortness of breath, peripheral edema, left-sided chest pain. History of hypertension, acute kidney injury, nonsmoker. EXAM: PORTABLE CHEST 1 VIEW COMPARISON:  Chest x-ray of October 07, 2011 FINDINGS: The lungs are mildly hypoinflated. The interstitial markings are coarse. There is a small right pleural effusion. There is new right basilar density. The cardiac silhouette is enlarged. There is calcification in the wall of the aortic arch. The pulmonary vascularity is engorged and indistinct. The observed bony thorax is unremarkable. IMPRESSION: CHF with mild interstitial edema. Right basilar atelectasis or pneumonia with small right pleural effusion. Thoracic aortic atherosclerosis. Electronically Signed   By: David  Martinique M.D.   On: 04/28/2018 11:35    Scheduled Meds: . brimonidine  1 drop Both Eyes Q12H   And  . timolol  1 drop Both Eyes Q12H  . feeding supplement (ENSURE ENLIVE)  237 mL Oral BID BM  . fentaNYL      . heparin  5,000 Units Subcutaneous Q8H  . latanoprost  1 drop Both Eyes QHS  . midazolam      . oxyCODONE  10 mg Oral Q12H  . polyethylene glycol  17 g Oral Daily  . tamsulosin  0.4 mg Oral Daily   Continuous Infusions:    LOS: 4 days    Time spent: Total of 25 minutes spent with pt, greater than 50% of which was spent in discussion of  treatment, counseling and coordination of care  Chipper Oman, MD Pager: Text Page via [email or web address]     If 7PM-7AM, please contact night-coverage [email or web address] 04/28/2018, 3:00 PM   Note - This record has been created using [employer]. Chart creation errors have been sought, but may not always have been located. Such creation errors do not reflect on the standard of medical care.

## 2018-04-28 NOTE — Care Management Important Message (Signed)
Important Message  Patient Details  Name: Michael [name] MRN: [id number] Date of Birth: [birth year]   Medicare Important Message Given:  Yes    Kerin Salen 04/28/2018, 12:57 [place] Message  Patient Details  Name: Michael [name] MRN: [id number] Date of Birth: Jun 22, 1946   Medicare Important Message Given:  Yes    Kerin Salen 04/28/2018, 12:57 PM

## 2018-04-28 NOTE — Telephone Encounter (Signed)
Tried reaching the pt.  Received a message that the mailbox is full and cannot accept messages.  Will try again at a later time.

## 2018-04-28 NOTE — Progress Notes (Signed)
CSW followed up with patient and provided bed offers, patient currently confused. CSW contacted patient's daughter listed on Epic Jequan Shahin [phone]) and discussed patient's discharge planning. CSW informed patient's daughter that PT is recommending SNF for ST rehab for patient. CSW explained SNF placement process and insurance authorization. CSW provided patient's daughter with bed offers. Patient's daughter inquired about SNFs in [place], [place] informed patient's daughter that if patient is interested in a SNF in [place] CSW would need the name and contact information to follow up. Patient's daughter reported that she wanted patient to complete HCPOA paperwork while patient was in the hospital, CSW explained that patient would have to be oriented and that the chaplin could help facilitate that process. Patient's daughter reported that she had concerns about patient's medications causing patient's confusion, CSW agreed to notify patient's RN to follow up regarding patient's daughter's concerns. Patient's daughter reported that she didn't want patient to receive biopsy results until her brother arrived in town on Thursday or Friday. CSW agreed to update patient's attending MD. Patient's daughter agreed to review bed offers and follow up with CSW. CSW agreed to update patient's RN and MD.  CSW updated patient's RN of patient's daughter's concerns. RN agreed to follow up with patient's MD.  CSW update patient's MD of patient's daughter's request.  Patient will need SNF selection to start [facility] authorization for ST rehab at [facility]. CSW awaiting return call from patient's daughter regarding SNF selection. CSW will continue to follow and assist with discharge planning.  Abundio Miu, [place] Social Worker [facility] Cell#: [phone]

## 2018-04-28 NOTE — NC FL2 (Signed)
[place] LEVEL OF CARE SCREENING TOOL     IDENTIFICATION  Patient Name: Crosley Stejskal Birthdate: 1946-07-13 Sex: male Admission Date (Current Location): 04/23/2018  [facility] and [place] Number:  Herbalist and Address:  [facility],  [address], [place]      Provider Number: [id number]  Attending Physician Name and Address:  Patrecia Pour, Christean Grief, MD  Relative Name and Phone Number:       Current Level of Care: Hospital Recommended Level of Care: [place] Prior Approval Number:    Date Approved/Denied:   PASRR Number: [id number]  Discharge Plan: SNF    Current Diagnoses: Patient Active Problem List   Diagnosis Date Noted  . Hypercalcemia 04/24/2018  . AKI (acute kidney injury) ([place]) 04/24/2018  . Metastasis ([place])   . Hyperlipidemia 02/24/2008  . OBESITY 01/22/2008  . Essential hypertension 01/22/2008  . ABNORMAL EJACULATION 01/22/2008  . SKIN LESION 01/22/2008    Orientation RESPIRATION BLADDER Height & Weight     Self, Time, Situation, Place  O2 Incontinent Weight: 290 lb (131.5 kg) Height:  6' (182.9 cm)  BEHAVIORAL SYMPTOMS/MOOD NEUROLOGICAL BOWEL NUTRITION STATUS      Incontinent Diet(see dc summary)  AMBULATORY STATUS COMMUNICATION OF NEEDS Skin   Extensive Assist Verbally Normal                       Personal Care Assistance Level of Assistance  Bathing, Feeding, Dressing Bathing Assistance: Maximum assistance Feeding assistance: Independent Dressing Assistance: Maximum assistance     Functional Limitations Info  Sight, Hearing, Speech Sight Info: Adequate Hearing Info: Adequate Speech Info: Adequate    SPECIAL CARE FACTORS FREQUENCY  PT (By licensed PT), OT (By licensed OT)     PT Frequency: 5x/week OT Frequency: 5x/week            Contractures      Additional Factors Info  Code Status, Allergies Code Status Info: Full Code Allergies Info: NKA            Current Medications (04/28/2018):  This is the current hospital active medication list Current Facility-Administered Medications  Medication Dose Route Frequency Provider Last Rate Last Dose  . acetaminophen (TYLENOL) tablet 650 mg  650 mg Oral Q6H PRN Etta Quill, DO       Or  . acetaminophen (TYLENOL) suppository 650 mg  650 mg Rectal Q6H PRN Etta Quill, DO      . brimonidine (ALPHAGAN) 0.2 % ophthalmic solution 1 drop  1 drop Both Eyes Q12H Jennette Kettle M, DO   1 drop at 04/28/18 1051   And  . timolol (TIMOPTIC) 0.5 % ophthalmic solution 1 drop  1 drop Both Eyes Q12H Jennette Kettle M, DO   1 drop at 04/28/18 1050  . docusate sodium (COLACE) capsule 200 mg  200 mg Oral Daily PRN Lovey Newcomer T, NP   200 mg at 04/27/18 1602  . feeding supplement (ENSURE ENLIVE) (ENSURE ENLIVE) liquid 237 mL  237 mL Oral BID BM Patrecia Pour, Christean Grief, MD   237 mL at 04/27/18 1603  . fentaNYL (SUBLIMAZE) 100 MCG/2ML injection           . heparin injection 5,000 Units  5,000 Units Subcutaneous Q8H Adrian Saran,       . HYDROcodone-acetaminophen (NORCO/VICODIN) 5-325 MG per tablet 1-2 tablet  1-2 tablet Oral Q6H PRN Etta Quill, DO   2 tablet at 04/28/18 0211  .  latanoprost (XALATAN) 0.005 % ophthalmic solution 1 drop  1 drop Both Eyes QHS Jennette Kettle M, DO   1 drop at 04/27/18 2112  . levalbuterol (XOPENEX) nebulizer solution 0.63 mg  0.63 mg Nebulization Q6H PRN Blount, Scarlette Shorts T, NP      . midazolam (VERSED) 2 MG/2ML injection           . ondansetron (ZOFRAN) tablet 4 mg  4 mg Oral Q6H PRN Etta Quill, DO       Or  . ondansetron [facility]) injection 4 mg  4 mg Intravenous Q6H PRN Etta Quill, DO      . oxyCODONE (OXYCONTIN) 12 hr tablet 10 mg  10 mg Oral Q12H Patrecia Pour, Christean Grief, MD   10 mg at 04/28/18 1033  . polyethylene glycol (MIRALAX / GLYCOLAX) packet 17 g  17 g Oral Daily Patrecia Pour, Christean Grief, MD   17 g at 04/28/18 1033  . tamsulosin (FLOMAX) capsule 0.4 mg  0.4 mg  Oral Daily Jennette Kettle M, DO   0.4 mg at 04/28/18 1033     Discharge Medications: Please see discharge summary for a list of discharge medications.  Relevant Imaging Results:  Relevant Lab Results:   Additional Information SSN  [id number]  [name], LCSW

## 2018-04-28 NOTE — Procedures (Signed)
Interventional Radiology Procedure Note  Procedure: CT guided bone lesion bx, left iliac bone lesion.  .  Complications: None Recommendations:  - Ok to shower tomorrow - Do not submerge for 7 days - Routine care   Signed,  Dulcy Fanny. Earleen Newport, DO

## 2018-04-28 NOTE — Clinical Social Work Note (Signed)
Clinical Social Work Assessment  Patient Details  Name: Michael [name] MRN: [id number] Date of Birth: Feb 09, 1946  Date of referral:  04/28/18               Reason for consult:  Facility Placement                Permission sought to share information with:  Chartered certified accountant granted to share information::  Yes, Verbal Permission Granted  Name::        Agency::     Relationship::     Contact Information:     Housing/Transportation Living arrangements for the past 2 months:  Single Family Home Source of Information:  Patient Patient Interpreter Needed:  None Criminal Activity/Legal Involvement Pertinent to Current Situation/Hospitalization:  No - Comment as needed Significant Relationships:  Adult Children, Friend Lives with:  Self Do you feel safe going back to the place where you live?  (PT recommending SNF) Need for family participation in patient care:     Care giving concerns:  Patient from home alone. Patient reported that prior to hospitalization he was independent with ambulation and ADLs. Patient reported that slipped off his couch and that his neighbor [employer]) found him. PT recommending SNF.    Social Worker assessment / plan:  CSW spoke with patient at bedside regarding PT recommendation for SNF. Patient reported that he believes his confusion has resolved and that he is agreeable to SNF for ST rehab. Patient reported that his son Montine Circle will be in town Thursday and that he wanted to speak with his son when he arrives. CSW explained SNF placement process and insurance authorization. CSW explained to patient that SNF placement and insurance authorization is a process and that plan needs to be in place prior to patient becoming medically stable, patient verbalized some understanding and agreed to accept bed offers today and discuss SNF selection. CSW inquired about patient's ability to private pay for SNF if patient's insurance does not authorize  ST rehab at SNF, patient reported that he may be able to pay partly but that he needs to study it first.  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 Patient reported that he has an income of "17 a month", CSW asked patient if that means 2500? Patient reported $25,000. CSW agreed to complete patient's FL2 and provide patient with bed offers to make SNF selection. CSW inquired if patient wanted CSW to contact any family to assist with discharge planning, patient declined and reported that his son would be in town on Thursday.    Employment status:  Part-Time Nurse, adult PT Recommendations:  [place] / Referral to community resources:  [place]  Patient/Family's Response to care:  Patient thanked CSW for assistance with discharge planning.   Patient/Family's Understanding of and Emotional Response to Diagnosis, Current Treatment, and Prognosis:  Patient presented calm and was engaged throughout assessment. Patient verbalized some understanding of current treatment plan, patient asked sitter multiple times what kind of test they were performing today. Patient verbalized plan to dc to SNF for ST rehab.   Emotional Assessment Appearance:  Appears stated age Attitude/Demeanor/Rapport:  Engaged Affect (typically observed):  Calm Orientation:  Oriented to Self, Oriented to  Time, Oriented to Place, Oriented to Situation(Patient had a little intermittent confusion) Alcohol / Substance use:  Not Applicable Psych involvement (Current and /or in the  community):  No (Comment)  Discharge Needs  Concerns to be addressed:  Care Coordination Readmission within the last 30 days:  No Current discharge risk:  Lives alone, Physical Impairment Barriers to Discharge:  Continued Medical Work up, [employer], LCSW 04/28/2018,  11:54 AM

## 2018-04-29 ENCOUNTER — Inpatient Hospital Stay (HOSPITAL_COMMUNITY): Payer: Medicare HMO

## 2018-04-29 DIAGNOSIS — N2889 Other specified disorders of kidney and ureter: Secondary | ICD-10-CM

## 2018-04-29 LAB — BASIC METABOLIC PANEL
ANION GAP: 13 (ref 5–15)
BUN: 25 mg/dL — ABNORMAL HIGH (ref 6–20)
CO2: 21 mmol/L — ABNORMAL LOW (ref 22–32)
Calcium: 7.9 mg/dL — ABNORMAL LOW (ref 8.9–10.3)
Chloride: 108 mmol/L (ref 101–111)
Creatinine, Ser: 1.46 mg/dL — ABNORMAL HIGH (ref 0.61–1.24)
GFR, EST AFRICAN AMERICAN: 55 mL/min — AB (ref >60)
GFR, EST NON AFRICAN AMERICAN: 47 mL/min — AB (ref >60)
Glucose, Bld: 107 mg/dL — ABNORMAL HIGH (ref 65–99)
POTASSIUM: 3.5 mmol/L (ref 3.5–5.1)
SODIUM: 142 mmol/L (ref 135–145)

## 2018-04-29 LAB — CBC WITH DIFFERENTIAL/PLATELET
BASOS ABS: 0 10*3/uL (ref 0.0–0.1)
BASOS PCT: 1 %
EOS PCT: 2 %
Eosinophils Absolute: 0.1 10*3/uL (ref 0.0–0.7)
HEMATOCRIT: 30.3 % — AB (ref 39.0–52.0)
HEMOGLOBIN: 10.1 g/dL — AB (ref 13.0–17.0)
LYMPHS PCT: 20 %
Lymphs Abs: 1.3 10*3/uL (ref 0.7–4.0)
MCH: 29.4 pg (ref 26.0–34.0)
MCHC: 33.3 g/dL (ref 30.0–36.0)
MCV: 88.1 fL (ref 78.0–100.0)
Monocytes Absolute: 0.8 10*3/uL (ref 0.1–1.0)
Monocytes Relative: 12 %
NEUTROS ABS: 4.2 10*3/uL (ref 1.7–7.7)
Neutrophils Relative %: 65 %
Platelets: 167 10*3/uL (ref 150–400)
RBC: 3.44 MIL/uL — ABNORMAL LOW (ref 4.22–5.81)
RDW: 13.8 % (ref 11.5–15.5)
WBC: 6.4 10*3/uL (ref 4.0–10.5)

## 2018-04-29 MED ORDER — FUROSEMIDE 10 MG/ML IJ SOLN
40.0000 mg | Freq: Once | INTRAMUSCULAR | Status: AC
  Administered 2018-04-29: 40 mg via INTRAVENOUS
  Filled 2018-04-29: qty 4

## 2018-04-29 NOTE — Progress Notes (Addendum)
Physical Therapy Treatment Patient Details Name: Michael [name] MRN: [id number] DOB: Dec 14, 1946 Today's Date: 04/29/2018    History of Present Illness 72 yo male admitted with hypercalcemia. CT + renal mass with extensive osseus mets, rib fxs, pathological L1 fx. Hx of morbid obesity    PT Comments    Progressing with mobility. Pt remains weak and at high risk for falls when mobilizing. Cognition has improved some since last session however he still has memory issues and requires cueing for safety. O2 sat 94% on RA during session. Audible wheezing noted. Continue to recommend SNF.     Follow Up Recommendations  SNF     Equipment Recommendations       Recommendations for Other Services       Precautions / Restrictions Precautions Precautions: Fall Restrictions Weight Bearing Restrictions: No    Mobility  Bed Mobility Overal bed mobility: Needs Assistance Bed Mobility: Supine to Sit     Supine to sit: Min guard;HOB elevated     General bed mobility comments: close guard for safety. increased time. pt relied on bedrail  Transfers Overall transfer level: Needs assistance Equipment used: Rolling walker (2 wheeled) Transfers: Sit to/from Stand Sit to Stand: Mod assist;+2 physical assistance;+2 safety/equipment         General transfer comment: Assist to rise, stabilize, control descent. Multimodal cueing required for safety, technique.   Ambulation/Gait Ambulation/Gait assistance: Min assist;+2 physical assistance;+2 safety/equipment Ambulation Distance (Feet): 20 Feet Assistive device: Rolling walker (2 wheeled) Gait Pattern/deviations: Step-to pattern;Step-through pattern;Trunk flexed;Decreased stride length     General Gait Details: Assist to stabilize pt and manuever safely with RW throughout distance. High fall risk. Pt fatigues quickly/easily. Pt continues to c/o LE weakness and instability.    Stairs             Wheelchair Mobility     Modified Rankin (Stroke Patients Only)       Balance Overall balance assessment: Needs assistance         Standing balance support: Bilateral upper extremity supported Standing balance-Leahy Scale: Poor                              Cognition Arousal/Alertness: Awake/alert Behavior During Therapy: WFL for tasks assessed/performed Overall Cognitive Status: No family/caregiver present to determine baseline cognitive functioning                   Orientation Level: Disoriented to;Time   Memory: Decreased short-term memory                Exercises      General Comments        Pertinent Vitals/Pain Pain Assessment: Faces Faces Pain Scale: Hurts little more Pain Location: abdomen and low back Pain Descriptors / Indicators: Discomfort;Aching Pain Intervention(s): Monitored during session;Repositioned    Home Living                      Prior Function            PT Goals (current goals can now be found in the care plan section) Progress towards PT goals: Progressing toward goals    Frequency    Min 2X/week      PT Plan Current plan remains appropriate    Co-evaluation              AM-PAC PT "6 Clicks" Daily Activity  Outcome Measure  Difficulty turning over in bed (  including adjusting bedclothes, sheets and blankets)?: A Lot Difficulty moving from lying on back to sitting on the side of the bed? : A Lot Difficulty sitting down on and standing up from a chair with arms (e.g., wheelchair, bedside commode, etc,.)?: Unable Help needed moving to and from a bed to chair (including a wheelchair)?: A Lot Help needed walking in hospital room?: A Lot Help needed climbing 3-5 steps with a railing? : Total 6 Click Score: 10    End of Session Equipment Utilized During Treatment: Gait belt Activity Tolerance: Patient limited by fatigue Patient left: in chair;with call bell/phone within reach;with chair alarm set   PT  Visit Diagnosis: Muscle weakness (generalized) (M62.81);Difficulty in walking, not elsewhere classified (R26.2);History of falling (Z91.81)     Time: 4401-0272 PT Time Calculation (min) (ACUTE ONLY): 19 min  Charges:  $Gait Training: 8-22 mins                    G Codes:         Weston Anna, MPT Pager: [phone]

## 2018-04-29 NOTE — Progress Notes (Signed)
CSW attempted to follow up with patient's daughter Dannis Deroche [phone]) regarding patient's discharge planning and SNF selection, no answer. CSW left voicemail requesting return phone call. CSW will continue to try to reach patient's daughter regarding SNF selection so patient's insurance authorization can be started. CSW will continue to follow and assist with discharge planning.  Abundio Miu, [place] Social Worker [facility] Cell#: [phone]

## 2018-04-29 NOTE — Progress Notes (Signed)
CSW received return call and voicemail from patient's daughter Cheick Suhr [phone])  CSW returned patient's daughter call and provided additional bed offers. CSW inquired about SNF selection to start insurance authorization. Patient's daughter reported that she is upset because she does not feel she has had enough time to make a SNF selection. CSW provided active listening and explained that insurance authorization is a process and that the SNF would have to start patient's insurance authorization. Patient's daughter inquired about what would happen if patient became medically stable before insurance authorization was received. CSW explained to patient's daughter that patient has the option to private pay for SNF if insurance authorization is not received. Patient's daughter reported that patient can not private pay and its not fair to the patient's family or patient to have to make a SNF selection in a short amount of time. CSW explained that a SNF has to be selected to start insurance authorization, patient's daughter reported that she may get back to CSW today but feels it's unfair. Patient's daughter reported that she is going to speak with patient's MD.   CSW awaiting SNF selection to ask SNF to start patient's insurance authorization. CSW awaiting call from patient's daughter with SNF selection.  CSW will continue to follow and assist with discharge planning.  Abundio Miu, [place] Social Worker [facility] Cell#: [phone]

## 2018-04-29 NOTE — Progress Notes (Signed)
Patient ID: Michael [name], male   DOB: February 17, 1946, 72 y.o.   MRN: [id number]  PROGRESS NOTE    Bravery Ketcham  JJO:[id number] DOB: December 10, 1946 DOA: 04/23/2018 PCP: Dorothyann Peng, NP   Brief Narrative:  72 year old male with history of hypertension presented on 04/23/2018 fatigue, generalized weakness and abdominal pain.  He was found to have elevated creatinine at 2.3 and hypercalcemia calcium level more than 15 he was admitted with hypercalcemia and acute kidney injury.  CT of the abdomen showed renal mass with osseous and pulmonary metastases concerning for malignancy.  He underwent left iliac bone biopsy on 04/28/2018 by IR.   Assessment & Plan:   Principal Problem:   Hypercalcemia Active Problems:   Essential hypertension   AKI (acute kidney injury) (HCC)   Metastasis (HCC)  Hypercalcemia -Probably from malignancy, differential diagnosis include multiple myeloma given lytic lesions. -Resolved.  Repeat a.m. Labs  Acute metabolic encephalopathy -Probably from hypercalcemia.  Waxing and waning symptoms.  This morning is more awake.  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  Pathology pending. -Continue pain management -Prior hospitalist had discussed with oncology who had recommended CT chest with contrast.  CT chest will be done once renal function improves. -Case was also discussed with urology by prior hospitalist who had recommended bone biopsy to identify type of cancer.  Fluid overload -We will give 1 more dose of Lasix.  Monitor input and output.  Repeat chest x-ray in a.m.  Acute renal failure - Creatinine improving.  Repeat a.m. Labs  Hypertension -Blood pressure on the higher side.  Monitor.  Continue amlodipine  Arachnoid cyst -  incidental  finding in CT scan. Prior hospitalist discussed with neurology: monitor.    DVT prophylaxis: Heparin Code Status: Full Family Communication: None at bedside Disposition Plan: SNF in 1 to 2 days  Consultants: IR.  Case discussed prior hospitalist with urology/oncology  Procedures: IR guided left iliac bone biopsy on 04/28/2018  Antimicrobials: None   Subjective: Patient seen and examined at bedside.  He is more awake this morning.  He is answering questions.  Feels slightly short of breath.  No overnight fever, nausea or vomiting.  Objective: Vitals:   04/28/18 2200 04/29/18 0214 04/29/18 0528 04/29/18 0800  BP: 117/86 137/71 (!) 149/89 (!) 155/97  Pulse: 90 89 91 96  Resp: (!) 22 20 20 18   Temp: 98.2 F (36.8 C) 97.6 F (36.4 C) 98.1 F (36.7 C) 98.5 F (36.9 C)  TempSrc: Oral Oral Oral Oral  SpO2: 97% 94% 97% 98%  Weight:      Height:        Intake/Output Summary (Last 24 hours) at 04/29/2018 1020 Last data filed at 04/29/2018 0600 Gross per 24 hour  Intake 650 ml  Output 2825 ml  Net -2175 ml   Filed Weights   04/23/18 2045  Weight: 131.5 kg (290 lb)    Examination:  General exam: Appears calm and comfortable.  Looks older than stated age. Respiratory system: Bilateral decreased breath sound at bases with scattered crackles Cardiovascular system: S1 & S2 heard, rate controlled  gastrointestinal system: Abdomen is nondistended, soft and nontender. Normal bowel sounds heard. Central nervous system: Awake and answering some questions. No focal neurological deficits. Moving extremities Extremities: No cyanosis, clubbing; 1+ pitting edema Skin: No rashes, lesions  or ulcers Lymph: No cervical lymphadenopathy    Data Reviewed: I have personally reviewed following labs and imaging studies  CBC: Recent Labs  Lab 04/23/18 2141 04/25/18 0446 04/27/18 0514 04/28/18 0503 04/29/18 0506  WBC 7.7 4.8 6.0 5.8 6.4  NEUTROABS  --  4.1 4.1  --  4.2  HGB  11.3* 9.6* 10.6* 10.0* 10.1*  HCT 34.3* 29.3* 32.5* 30.3* 30.3*  MCV 90.0 91.0 89.5 88.6 88.1  PLT 271 218 196 188 952   Basic Metabolic Panel: Recent Labs  Lab 04/25/18 1644 04/26/18 0442 04/27/18 0514 04/28/18 0509 04/29/18 0506  NA 145 141 141 141 142  K 4.2 4.0 4.1 3.7 3.5  CL 114* 112* 110 110 108  CO2 22 21* 20* 20* 21*  GLUCOSE 94 103* 98 104* 107*  BUN 40* 33* 27* 25* 25*  CREATININE 1.97* 1.77* 1.61* 1.47* 1.46*  CALCIUM 10.5* 9.5 9.0 8.4* 7.9*  MG  --   --   --  1.4*  --    GFR: Estimated Creatinine Clearance: 67 mL/min (A) (by C-G formula based on SCr of 1.46 mg/dL (H)). Liver Function Tests: Recent Labs  Lab 04/24/18 0152 04/25/18 0446 04/26/18 0442 04/27/18 0514 04/28/18 0509 04/28/18 1724  AST 36  --   --  27  --   --   ALT 32  --   --  24  --   --   ALKPHOS 175*  --   --  218*  --   --   BILITOT 1.2  --   --  0.7  --   --   PROT 6.5  --   --  5.6*  --   --   ALBUMIN 3.1* 2.5* 2.4* 2.6* 2.4* 2.5*   No results for input(s): LIPASE, AMYLASE in the last 168 hours. No results for input(s): AMMONIA in the last 168 hours. Coagulation Profile: Recent Labs  Lab 04/27/18 0514  INR 1.12   Cardiac Enzymes: Recent Labs  Lab 04/23/18 2143  CKTOTAL 196   BNP (last 3 results) No results for input(s): PROBNP in the last 8760 hours. HbA1C: No results for input(s): HGBA1C in the last 72 hours. CBG: Recent Labs  Lab 04/23/18 2150  GLUCAP 98   Lipid Profile: No results for input(s): CHOL, HDL, LDLCALC, TRIG, CHOLHDL, LDLDIRECT in the last 72 hours. Thyroid Function Tests: No results for input(s): TSH, T4TOTAL, FREET4, T3FREE, THYROIDAB in the last 72 hours. Anemia Panel: No results for input(s): VITAMINB12, FOLATE, FERRITIN, TIBC, IRON, RETICCTPCT in the last 72 hours. Sepsis Labs: Recent Labs  Lab 04/28/18 1724  PROCALCITON 2.63    No results found for this or any previous visit (from the past 240 hour(s)).       Radiology Studies: Ct  Biopsy  Result Date: 04/28/2018 INDICATION: 72 year old male with a history of metastatic bone lesions. He has been referred for a targeted bone lesion biopsy EXAM: CT BIOPSY MEDICATIONS: None. ANESTHESIA/SEDATION: Moderate (conscious) sedation was employed during this procedure. A total of Versed 1.5 mg and Fentanyl 75 mcg was administered intravenously. Moderate Sedation Time: 14 minutes. The patient's level of consciousness and vital signs were monitored continuously by radiology nursing throughout the procedure under my direct supervision. FLUOROSCOPY TIME:  CT COMPLICATIONS: None PROCEDURE: The procedure risks, benefits, and alternatives were explained to the patient. Questions regarding the procedure were encouraged and answered. The patient understands and consents to the procedure. Scout CT of the pelvis was performed for surgical planning purposes. The posterior pelvis  was prepped with chlorhexidinein a sterile fashion, and a sterile drape was applied covering the operative field. A sterile gown and sterile gloves were used for the procedure. Local anesthesia was provided with 1% Lidocaine. We targeted the lesion in the posterior left iliac bone for biopsy. The skin and subcutaneous tissues were infiltrated with 1% lidocaine without epinephrine. A small stab incision was made with an 11 blade scalpel, and an 11 gauge Murphy needle was advanced with CT guidance to the posterior cortex. Manual forced was used to advance the needle through the posterior cortex and the stylet was removed. Once the needle was confirmed just proximal to the soft tissue lesion of the left iliac bone. A coaxial 17 gauge needle was advanced. Multiple 18 gauge core biopsy were then achieved and placed into formalin. The coaxial needle was then removed, and 11 gauge core biopsy was performed of the soft tissue lesion/lytic lesion with the [facility] needle. Manual pressure was used for hemostasis and a sterile dressing was placed. No  complications were encountered no significant blood loss was encountered. Patient tolerated the procedure well and remained hemodynamically stable throughout. IMPRESSION: Status post targeted bone lesion biopsy of the left posterior iliac bone. Signed, Dulcy Fanny. Earleen Newport, DO [facility] Electronically Signed   By: Corrie Mckusick D.O.   On: 04/28/2018 09:41   Dg Chest Port 1 View  Result Date: 04/28/2018 CLINICAL DATA:  Shortness of breath, peripheral edema, left-sided chest pain. History of hypertension, acute kidney injury, nonsmoker. EXAM: PORTABLE CHEST 1 VIEW COMPARISON:  Chest x-ray of October 07, 2011 FINDINGS: The lungs are mildly hypoinflated. The interstitial markings are coarse. There is a small right pleural effusion. There is new right basilar density. The cardiac silhouette is enlarged. There is calcification in the wall of the aortic arch. The pulmonary vascularity is engorged and indistinct. The observed bony thorax is unremarkable. IMPRESSION: CHF with mild interstitial edema. Right basilar atelectasis or pneumonia with small right pleural effusion. Thoracic aortic atherosclerosis. Electronically Signed   By: David  Martinique M.D.   On: 04/28/2018 11:35        Scheduled Meds: . amLODipine  10 mg Oral Daily  . brimonidine  1 drop Both Eyes Q12H   And  . timolol  1 drop Both Eyes Q12H  . feeding supplement (ENSURE ENLIVE)  237 mL Oral BID BM  . furosemide  40 mg Intravenous Once  . heparin  5,000 Units Subcutaneous Q8H  . latanoprost  1 drop Both Eyes QHS  . polyethylene glycol  17 g Oral Daily  . tamsulosin  0.4 mg Oral Daily   Continuous Infusions:   LOS: 5 days        Aline August, MD [facility] Pager [phone]  If 7PM-7AM, please contact night-coverage [email or web address] Password [email or web address] 04/29/2018, 10:20 AM

## 2018-04-30 ENCOUNTER — Telehealth: Payer: Self-pay | Admitting: *Deleted

## 2018-04-30 DIAGNOSIS — C642 Malignant neoplasm of left kidney, except renal pelvis: Secondary | ICD-10-CM

## 2018-04-30 DIAGNOSIS — C649 Malignant neoplasm of unspecified kidney, except renal pelvis: Secondary | ICD-10-CM

## 2018-04-30 DIAGNOSIS — G893 Neoplasm related pain (acute) (chronic): Secondary | ICD-10-CM

## 2018-04-30 DIAGNOSIS — C7951 Secondary malignant neoplasm of bone: Principal | ICD-10-CM

## 2018-04-30 DIAGNOSIS — C7800 Secondary malignant neoplasm of unspecified lung: Secondary | ICD-10-CM

## 2018-04-30 DIAGNOSIS — N179 Acute kidney failure, unspecified: Secondary | ICD-10-CM

## 2018-04-30 LAB — COMPREHENSIVE METABOLIC PANEL
ALBUMIN: 2.6 g/dL — AB (ref 3.5–5.0)
ALT: 61 U/L (ref 17–63)
AST: 67 U/L — ABNORMAL HIGH (ref 15–41)
Alkaline Phosphatase: 340 U/L — ABNORMAL HIGH (ref 38–126)
Anion gap: 13 (ref 5–15)
BUN: 26 mg/dL — AB (ref 6–20)
CHLORIDE: 106 mmol/L (ref 101–111)
CO2: 25 mmol/L (ref 22–32)
Calcium: 7.9 mg/dL — ABNORMAL LOW (ref 8.9–10.3)
Creatinine, Ser: 1.4 mg/dL — ABNORMAL HIGH (ref 0.61–1.24)
GFR calc non Af Amer: 50 mL/min — ABNORMAL LOW (ref >60)
GFR, EST AFRICAN AMERICAN: 58 mL/min — AB (ref >60)
GLUCOSE: 100 mg/dL — AB (ref 65–99)
Potassium: 3.5 mmol/L (ref 3.5–5.1)
SODIUM: 144 mmol/L (ref 135–145)
Total Bilirubin: 1.5 mg/dL — ABNORMAL HIGH (ref 0.3–1.2)
Total Protein: 5.7 g/dL — ABNORMAL LOW (ref 6.5–8.1)

## 2018-04-30 LAB — CBC WITH DIFFERENTIAL/PLATELET
BASOS ABS: 0 10*3/uL (ref 0.0–0.1)
BASOS PCT: 0 %
EOS ABS: 0.1 10*3/uL (ref 0.0–0.7)
Eosinophils Relative: 2 %
HCT: 30.3 % — ABNORMAL LOW (ref 39.0–52.0)
HEMOGLOBIN: 10.1 g/dL — AB (ref 13.0–17.0)
LYMPHS ABS: 1.3 10*3/uL (ref 0.7–4.0)
Lymphocytes Relative: 20 %
MCH: 29.4 pg (ref 26.0–34.0)
MCHC: 33.3 g/dL (ref 30.0–36.0)
MCV: 88.3 fL (ref 78.0–100.0)
Monocytes Absolute: 0.7 10*3/uL (ref 0.1–1.0)
Monocytes Relative: 10 %
Neutro Abs: 4.6 10*3/uL (ref 1.7–7.7)
Neutrophils Relative %: 68 %
Platelets: 167 10*3/uL (ref 150–400)
RBC: 3.43 MIL/uL — ABNORMAL LOW (ref 4.22–5.81)
RDW: 14 % (ref 11.5–15.5)
WBC: 6.7 10*3/uL (ref 4.0–10.5)

## 2018-04-30 LAB — MAGNESIUM: Magnesium: 1.7 mg/dL (ref 1.7–2.4)

## 2018-04-30 MED ORDER — FUROSEMIDE 10 MG/ML IJ SOLN
60.0000 mg | Freq: Once | INTRAMUSCULAR | Status: AC
  Administered 2018-04-30: 60 mg via INTRAVENOUS
  Filled 2018-04-30: qty 6

## 2018-04-30 MED ORDER — ATORVASTATIN CALCIUM 20 MG PO TABS
20.0000 mg | ORAL_TABLET | Freq: Every day | ORAL | Status: DC
  Administered 2018-04-30: 20 mg via ORAL
  Filled 2018-04-30: qty 1

## 2018-04-30 NOTE — Progress Notes (Addendum)
Subjective:  Michael [name] is a 72 year old man who was admitted to the hospital on 04/23/18 with fatigue, weakness, abdominal pain, and found to have AKI with creatinine of 2.36 and Calcium of greater than 15.  CT abdomen and pelvis demonstrated a renal mass concerning for cancer with lesions on the bone and in the lung as well.  On 5/7 Michael [name] underwent a left iliac biopsy demonstrating metastatic carcinoma, consistent with renal primary.    I am meeting Michael [name] for the first time today.  He tells me that he is doing moderately well.  His main goals are to be discharged from the hospital by the end of the week, and to regain his strength.  He tells me that mobility is a big issue, and he is ready to work on his strength and improving it.  He also tells me that pain has been an issue for him.  He feels it mostly in his lower back and at the top of his chest.  He thinks some of it may be related to his uncomfortable bed.    Objective:  BP 129/76 (BP Location: Left Arm)   Pulse 92   Temp 98 F (36.7 C) (Oral)   Resp 18   Ht 6' (1.829 m)   Wt 287 lb 0.6 oz (130.2 kg)   SpO2 100%   BMI 38.93 kg/m  GENERAL: Patient is an older man, wearing oxygen, lying in a hospital bed, NAD HEENT:  Sclerae anicteric.  Oropharynx clear and moist. No ulcerations or evidence of oropharyngeal candidiasis.  LUNGS:  Clear to auscultation bilaterally.  No wheezes or rhonchi. HEART:  Regular rate and rhythm. No murmur appreciated. ABDOMEN:  Soft, nontender.  Positive, normoactive bowel sounds. No organomegaly palpated. EXTREMITIES:  Bilateral lower extremity edema noted SKIN:  Clear with no obvious rashes or skin changes. No nail dyscrasia. NEURO:  Nonfocal. Well oriented.  Appropriate affect.  A/P:  Michael [name] and I talked today briefly about the following.  1. Renal Carcinoma: I reviewed his imaging with him.  I clarified, that he doesn't have three separate cancers, but that the cancer started in the kidney and spread to the  lung and the bone.  I gave him the card of our oncology specialist in this area, Dr. Alice Rieger.  I have already spoken to Dr. Alen Blew and he is aware of Michael [name] and his case.  He will see him as an outpatient, new patient in the next 1-2 weeks.  I let Michael [name] know that our office will call him to notify him of an appointment.    2. Pain: Patient is taking Vicodin PRN  3. AKI: creatinine is improving, most recently 1.4  4.  Hypercalcemia: corrected calcium around 9 today, much improved from prior.    5. Advanced directives: I asked Michael [name] who his next of kin is, and he told me his youngest Michael [name].  He did inform me that he would be making an appointment once his condition allows him to do so, with an attorney to review his advanced directives, and will.  I offered to get him more information about advanced directives and he declined.    6. Deconditioning: Patient will be discharged to [facility].  He will work on strength there.  I recommended that he continue to work on his strength.    Please feel free to reach out if we can be of any additional assistance to this patient.   Michael Bihari, NP [phone]  Attending Note  I personally saw the patient, reviewed the chart and examined the patient. The plan of care was discussed with the patient and the admitting team. I agree with the assessment and plan as documented above. Thank you very much for the consultation. - Dr.Shaddad will follow him as out-patient - Needs aggressive PT/OT to get him stronger - Renal failure: Improving - deconditioning

## 2018-04-30 NOTE — Progress Notes (Addendum)
Patient ID: Michael [name], male   DOB: 10-08-1946, 72 y.o.   MRN: [id number]  PROGRESS NOTE    Michael [name]  OFH:[id number] DOB: Mar 20, 1946 DOA: 04/23/2018 PCP: Dorothyann Peng, NP   Brief Narrative:  72 year old male with history of hypertension presented on 04/23/2018 fatigue, generalized weakness and abdominal pain.  He was found to have elevated creatinine at 2.3 and hypercalcemia calcium level more than 15 he was admitted with hypercalcemia and acute kidney injury.  CT of the abdomen showed renal mass with osseous and pulmonary metastases concerning for malignancy.  He underwent left iliac bone biopsy on 04/28/2018 by IR.   Assessment & Plan:   Principal Problem:   Hypercalcemia Active Problems:   Essential hypertension   AKI (acute kidney injury) (HCC)   Metastasis (HCC)  Hypercalcemia -Probably from malignancy, differential diagnosis include multiple myeloma given lytic lesions. -Resolved.  Repeat a.m. Labs  Acute metabolic encephalopathy -Probably from hypercalcemia.  Waxing and waning symptoms.   -Improving.  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  Pathology is consistent with metastatic probable renal cancer.  Spoke to urology/Dr. Karsten Ro on phone who recommended oncology evaluation.  I have called oncology/Dr. Lindi Adie and awaiting callback -Continue pain management -Prior hospitalist had discussed with oncology who had recommended CT chest with contrast.  CT chest will be done tomorrow if renal function remains stable. -Case was also discussed with urology by prior hospitalist who had recommended bone biopsy to identify type of cancer.  Fluid overload -Status post Lasix dose again yesterday.  Will give Lasix again  today.  Repeat chest x-ray in a.m.  Acute renal failure - Creatinine improving.  Repeat a.m. Labs  Hypertension -Blood pressure stable.  Monitor.  Continue amlodipine  Arachnoid cyst - incidental  finding in CT scan. Prior hospitalist discussed with neurology: monitor.    DVT prophylaxis: Heparin Code Status: Full Family Communication: None at bedside Disposition Plan: SNF in 1 to 3 days  Consultants: IR.  Case discussed prior hospitalist with urology/oncology  Procedures: IR guided left iliac bone biopsy on 04/28/2018  Antimicrobials: None   Subjective: Patient seen and examined at bedside.  He is awake today and answers questions.  No overnight fever, nausea, vomiting or chest pain. Objective: Vitals:   04/29/18 1200 04/29/18 1207 04/29/18 2127 04/30/18 0605  BP: 133/82 133/82 131/89 129/76  Pulse: 98 98 97 92  Resp: _0 Temp: 98.7 F (37.1 C) 98.7 F (37.1 C) 98.2 F (36.8 C) 98 F (36.7 C)  TempSrc: Oral Oral  Oral  SpO2: 95% 95% 97% 100%  Weight: 133 kg (293 lb 3.4 oz)   130.2 kg (287 lb 0.6 oz)  Height:        Intake/Output Summary (Last 24 hours) at 04/30/2018 0822 Last data filed at 04/30/2018 0600 Gross per 24 hour  Intake 0 ml  Output 475 ml  Net -475 ml   Filed Weights   04/23/18 2045 04/29/18 1200 04/30/18 0605  Weight: 131.5 kg (290 lb) 133 kg (293 lb 3.4 oz) 130.2 kg (287 lb 0.6 oz)    Examination:  General exam: No acute distress.  Looks older than stated age. Respiratory system: Bilateral decreased breath sound at bases with scattered crackles, no wheezing Cardiovascular system: Rate controlled, S1-S2 heard  gastrointestinal system: Abdomen is nondistended, soft and nontender. Normal bowel sounds heard. Extremities: No cyanosis; 1+ pitting edema    Data Reviewed: I have personally reviewed following labs and imaging studies  CBC: Recent Labs  Lab 04/25/18 0446 04/27/18 0514 04/28/18 0503 04/29/18 0506 04/30/18 0426  WBC 4.8  6.0 5.8 6.4 6.7  NEUTROABS 4.1 4.1  --  4.2 4.6  HGB 9.6* 10.6* 10.0* 10.1* 10.1*  HCT 29.3* 32.5* 30.3* 30.3* 30.3*  MCV 91.0 89.5 88.6 88.1 88.3  PLT 218 196 188 167 656   Basic Metabolic Panel: Recent Labs  Lab 04/26/18 0442 04/27/18 0514 04/28/18 0509 04/29/18 0506 04/30/18 0426  NA 141 141 141 142 144  K 4.0 4.1 3.7 3.5 3.5  CL 112* 110 110 108 106  CO2 21* 20* 20* 21* 25  GLUCOSE 103* 98 104* 107* 100*  BUN 33* 27* 25* 25* 26*  CREATININE 1.77* 1.61* 1.47* 1.46* 1.40*  CALCIUM 9.5 9.0 8.4* 7.9* 7.9*  MG  --   --  1.4*  --  1.7   GFR: Estimated Creatinine Clearance: 69.5 mL/min (A) (by C-G formula based on SCr of 1.4 mg/dL (H)). Liver Function Tests: Recent Labs  Lab 04/24/18 0152  04/26/18 0442 04/27/18 0514 04/28/18 0509 04/28/18 1724 04/30/18 0426  AST 36  --   --  27  --   --  67*  ALT 32  --   --  24  --   --  61  ALKPHOS 175*  --   --  218*  --   --  340*  BILITOT 1.2  --   --  0.7  --   --  1.5*  PROT 6.5  --   --  5.6*  --   --  5.7*  ALBUMIN 3.1*   < > 2.4* 2.6* 2.4* 2.5* 2.6*   < > = values in this interval not displayed.   No results for input(s): LIPASE, AMYLASE in the last 168 hours. No results for input(s): AMMONIA in the last 168 hours. Coagulation Profile: Recent Labs  Lab 04/27/18 0514  INR 1.12   Cardiac Enzymes: Recent Labs  Lab 04/23/18 2143  CKTOTAL 196   BNP (last 3 results) No results for input(s): PROBNP in the last 8760 hours. HbA1C: No results for input(s): HGBA1C in the last 72 hours. CBG: Recent Labs  Lab 04/23/18 2150  GLUCAP 98   Lipid Profile: No results for input(s): CHOL, HDL, LDLCALC, TRIG, CHOLHDL, LDLDIRECT in the last 72 hours. Thyroid Function Tests: No results for input(s): TSH, T4TOTAL, FREET4, T3FREE, THYROIDAB in the last 72 hours. Anemia Panel: No results for input(s): VITAMINB12, FOLATE, FERRITIN, TIBC, IRON, RETICCTPCT in the last 72 hours. Sepsis Labs: Recent Labs  Lab 04/28/18 1724    PROCALCITON 2.63    No results found for this or any previous visit (from the past 240 hour(s)).       Radiology Studies: Dg Chest 2 View  Result Date: 04/29/2018 CLINICAL DATA:  Dyspnea and hypertension. EXAM: CHEST - 2 VIEW COMPARISON:  Chest x-ray on 04/28/2018. Prior abdominal CT on 04/24/2018. FINDINGS: Stable cardiac enlargement. Innumerable small bilateral pulmonary nodules remain present suspicious for metastatic disease. Improved aeration at the right base with some residual atelectasis remaining and elevation of the right hemidiaphragm. No significant pleural fluid. IMPRESSION: Innumerable small bilateral pulmonary nodules suspicious for diffuse metastatic disease. CT of the chest is recommended for further evaluation as only the lung bases were included on the prior abdomen  CT. Aeration at the right lung base appears improved since the prior chest x-ray. Electronically Signed   By: Aletta Edouard M.D.   On: 04/29/2018 21:08   Ct Biopsy  Result Date: 04/28/2018 INDICATION: 72 year old male with a history of metastatic bone lesions. He has been referred for a targeted bone lesion biopsy EXAM: CT BIOPSY MEDICATIONS: None. ANESTHESIA/SEDATION: Moderate (conscious) sedation was employed during this procedure. A total of Versed 1.5 mg and Fentanyl 75 mcg was administered intravenously. Moderate Sedation Time: 14 minutes. The patient's level of consciousness and vital signs were monitored continuously by radiology nursing throughout the procedure under my direct supervision. FLUOROSCOPY TIME:  CT COMPLICATIONS: None PROCEDURE: The procedure risks, benefits, and alternatives were explained to the patient. Questions regarding the procedure were encouraged and answered. The patient understands and consents to the procedure. Scout CT of the pelvis was performed for surgical planning purposes. The posterior pelvis was prepped with chlorhexidinein a sterile fashion, and a sterile drape was applied  covering the operative field. A sterile gown and sterile gloves were used for the procedure. Local anesthesia was provided with 1% Lidocaine. We targeted the lesion in the posterior left iliac bone for biopsy. The skin and subcutaneous tissues were infiltrated with 1% lidocaine without epinephrine. A small stab incision was made with an 11 blade scalpel, and an 11 gauge Murphy needle was advanced with CT guidance to the posterior cortex. Manual forced was used to advance the needle through the posterior cortex and the stylet was removed. Once the needle was confirmed just proximal to the soft tissue lesion of the left iliac bone. A coaxial 17 gauge needle was advanced. Multiple 18 gauge core biopsy were then achieved and placed into formalin. The coaxial needle was then removed, and 11 gauge core biopsy was performed of the soft tissue lesion/lytic lesion with the [facility] needle. Manual pressure was used for hemostasis and a sterile dressing was placed. No complications were encountered no significant blood loss was encountered. Patient tolerated the procedure well and remained hemodynamically stable throughout. IMPRESSION: Status post targeted bone lesion biopsy of the left posterior iliac bone. Signed, Dulcy Fanny. Earleen Newport, DO [facility] Electronically Signed   By: Corrie Mckusick D.O.   On: 04/28/2018 09:41   Dg Chest Port 1 View  Result Date: 04/28/2018 CLINICAL DATA:  Shortness of breath, peripheral edema, left-sided chest pain. History of hypertension, acute kidney injury, nonsmoker. EXAM: PORTABLE CHEST 1 VIEW COMPARISON:  Chest x-ray of October 07, 2011 FINDINGS: The lungs are mildly hypoinflated. The interstitial markings are coarse. There is a small right pleural effusion. There is new right basilar density. The cardiac silhouette is enlarged. There is calcification in the wall of the aortic arch. The pulmonary vascularity is engorged and indistinct.  The observed bony thorax is unremarkable. IMPRESSION: CHF with mild interstitial edema. Right basilar atelectasis or pneumonia with small right pleural effusion. Thoracic aortic atherosclerosis. Electronically Signed   By: David  Martinique M.D.   On: 04/28/2018 11:35        Scheduled Meds: . amLODipine  10 mg Oral Daily  . brimonidine  1 drop Both Eyes Q12H   And  . timolol  1 drop Both Eyes Q12H  . feeding supplement (ENSURE ENLIVE)  237 mL Oral BID BM  . heparin  5,000 Units Subcutaneous Q8H  . latanoprost  1 drop Both Eyes QHS  . polyethylene glycol  17 g Oral Daily  . tamsulosin  0.4 mg Oral  Daily   Continuous Infusions:   LOS: 6 days        Aline August, MD [facility] Pager [phone]  If 7PM-7AM, please contact night-coverage [email or web address] Password [email or web address] 04/30/2018, 8:22 AM

## 2018-04-30 NOTE — Telephone Encounter (Signed)
Daughter Ebony Hail calling for results of CT biopsy.

## 2018-04-30 NOTE — Progress Notes (Signed)
CSW contacted by patient's son Latham Kinzler [phone]) and informed that he was updated by his sister/patient's daughter Olive Motyka) about patient's discharge planning. CSW explained SNF placement process and insurance authorization, patient's son verbalized understanding and reported that he would review patient's bed offers and select a SNF do SNF can start insurance authorization. Patient's son reported that he would be in town tomorrow to see patient. Patient's son agreed to contact CSW with SNF selection. CSW awaiting return call from patient's son. CSW will continue to follow and assist with discharge planning.  Abundio Miu, [place] Social Worker [facility] Cell#: [phone]

## 2018-04-30 NOTE — Progress Notes (Signed)
CSW received voicemail from patient's son stating he selected [name] for patient after speaking with all of patient's children. Patient's son requested that CSW contact SNF to start insurance authorization.  CSW contacted [name] and spoke with admissions staff Narda Rutherford, staff confirmed bed offer and agreed to start insurance authorization for patient.  CSW contacted patient's son [name]) to provide update, no answer. CSW left voicemail requesting return phone call.  CSW will continue to follow and assist with discharge planning.  Abundio Miu, [place] Social Worker [facility] Cell#: [phone]

## 2018-04-30 NOTE — Telephone Encounter (Signed)
Pt currently admitted.

## 2018-05-01 ENCOUNTER — Inpatient Hospital Stay (HOSPITAL_COMMUNITY): Payer: Medicare HMO

## 2018-05-01 ENCOUNTER — Encounter: Payer: Self-pay | Admitting: *Deleted

## 2018-05-01 ENCOUNTER — Telehealth: Payer: Self-pay | Admitting: Oncology

## 2018-05-01 DIAGNOSIS — R279 Unspecified lack of coordination: Secondary | ICD-10-CM | POA: Diagnosis not present

## 2018-05-01 DIAGNOSIS — M549 Dorsalgia, unspecified: Secondary | ICD-10-CM | POA: Diagnosis not present

## 2018-05-01 DIAGNOSIS — C642 Malignant neoplasm of left kidney, except renal pelvis: Secondary | ICD-10-CM | POA: Diagnosis not present

## 2018-05-01 DIAGNOSIS — M6281 Muscle weakness (generalized): Secondary | ICD-10-CM | POA: Diagnosis not present

## 2018-05-01 DIAGNOSIS — C799 Secondary malignant neoplasm of unspecified site: Secondary | ICD-10-CM | POA: Diagnosis not present

## 2018-05-01 DIAGNOSIS — Z515 Encounter for palliative care: Secondary | ICD-10-CM | POA: Diagnosis not present

## 2018-05-01 DIAGNOSIS — R2689 Other abnormalities of gait and mobility: Secondary | ICD-10-CM | POA: Diagnosis not present

## 2018-05-01 DIAGNOSIS — L859 Epidermal thickening, unspecified: Secondary | ICD-10-CM | POA: Diagnosis not present

## 2018-05-01 DIAGNOSIS — R739 Hyperglycemia, unspecified: Secondary | ICD-10-CM | POA: Diagnosis not present

## 2018-05-01 DIAGNOSIS — N401 Enlarged prostate with lower urinary tract symptoms: Secondary | ICD-10-CM | POA: Diagnosis not present

## 2018-05-01 DIAGNOSIS — E785 Hyperlipidemia, unspecified: Secondary | ICD-10-CM | POA: Diagnosis not present

## 2018-05-01 DIAGNOSIS — N2889 Other specified disorders of kidney and ureter: Secondary | ICD-10-CM | POA: Diagnosis not present

## 2018-05-01 DIAGNOSIS — R278 Other lack of coordination: Secondary | ICD-10-CM | POA: Diagnosis not present

## 2018-05-01 DIAGNOSIS — C649 Malignant neoplasm of unspecified kidney, except renal pelvis: Secondary | ICD-10-CM | POA: Diagnosis not present

## 2018-05-01 DIAGNOSIS — C7951 Secondary malignant neoplasm of bone: Secondary | ICD-10-CM | POA: Diagnosis not present

## 2018-05-01 DIAGNOSIS — R079 Chest pain, unspecified: Secondary | ICD-10-CM | POA: Diagnosis not present

## 2018-05-01 DIAGNOSIS — N179 Acute kidney failure, unspecified: Secondary | ICD-10-CM | POA: Diagnosis not present

## 2018-05-01 DIAGNOSIS — I10 Essential (primary) hypertension: Secondary | ICD-10-CM | POA: Diagnosis not present

## 2018-05-01 DIAGNOSIS — R0902 Hypoxemia: Secondary | ICD-10-CM | POA: Diagnosis not present

## 2018-05-01 DIAGNOSIS — Z743 Need for continuous supervision: Secondary | ICD-10-CM | POA: Diagnosis not present

## 2018-05-01 DIAGNOSIS — C7800 Secondary malignant neoplasm of unspecified lung: Secondary | ICD-10-CM | POA: Diagnosis not present

## 2018-05-01 DIAGNOSIS — R0602 Shortness of breath: Secondary | ICD-10-CM | POA: Diagnosis not present

## 2018-05-01 LAB — CBC WITH DIFFERENTIAL/PLATELET
BASOS PCT: 0 %
Basophils Absolute: 0 10*3/uL (ref 0.0–0.1)
EOS ABS: 0.1 10*3/uL (ref 0.0–0.7)
Eosinophils Relative: 1 %
HCT: 30.7 % — ABNORMAL LOW (ref 39.0–52.0)
Hemoglobin: 10.2 g/dL — ABNORMAL LOW (ref 13.0–17.0)
LYMPHS ABS: 1.2 10*3/uL (ref 0.7–4.0)
Lymphocytes Relative: 15 %
MCH: 29.1 pg (ref 26.0–34.0)
MCHC: 33.2 g/dL (ref 30.0–36.0)
MCV: 87.7 fL (ref 78.0–100.0)
MONO ABS: 0.8 10*3/uL (ref 0.1–1.0)
Monocytes Relative: 11 %
NEUTROS ABS: 5.6 10*3/uL (ref 1.7–7.7)
Neutrophils Relative %: 73 %
Platelets: 203 10*3/uL (ref 150–400)
RBC: 3.5 MIL/uL — ABNORMAL LOW (ref 4.22–5.81)
RDW: 14 % (ref 11.5–15.5)
WBC: 7.7 10*3/uL (ref 4.0–10.5)

## 2018-05-01 LAB — BASIC METABOLIC PANEL
Anion gap: 12 (ref 5–15)
BUN: 27 mg/dL — AB (ref 6–20)
CALCIUM: 7.6 mg/dL — AB (ref 8.9–10.3)
CO2: 26 mmol/L (ref 22–32)
CREATININE: 1.35 mg/dL — AB (ref 0.61–1.24)
Chloride: 104 mmol/L (ref 101–111)
GFR calc non Af Amer: 52 mL/min — ABNORMAL LOW (ref >60)
Glucose, Bld: 113 mg/dL — ABNORMAL HIGH (ref 65–99)
Potassium: 3.5 mmol/L (ref 3.5–5.1)
SODIUM: 142 mmol/L (ref 135–145)

## 2018-05-01 LAB — MAGNESIUM: Magnesium: 1.6 mg/dL — ABNORMAL LOW (ref 1.7–2.4)

## 2018-05-01 MED ORDER — FUROSEMIDE 10 MG/ML IJ SOLN
60.0000 mg | Freq: Once | INTRAMUSCULAR | Status: AC
  Administered 2018-05-01: 60 mg via INTRAVENOUS
  Filled 2018-05-01: qty 6

## 2018-05-01 MED ORDER — AMLODIPINE BESYLATE 10 MG PO TABS: 10.0000 mg | ORAL_TABLET | Freq: Every day | ORAL | 0 refills | Status: AC

## 2018-05-01 MED ORDER — MAGNESIUM SULFATE 2 GM/50ML IV SOLN
2.0000 g | Freq: Once | INTRAVENOUS | Status: AC
  Administered 2018-05-01: 2 g via INTRAVENOUS
  Filled 2018-05-01: qty 50

## 2018-05-01 MED ORDER — FUROSEMIDE 40 MG PO TABS: 40.0000 mg | ORAL_TABLET | Freq: Two times a day (BID) | ORAL | 0 refills | Status: AC

## 2018-05-01 MED ORDER — DOCUSATE SODIUM 100 MG PO CAPS: 200.0000 mg | ORAL_CAPSULE | Freq: Every day | ORAL | 0 refills | Status: AC | PRN

## 2018-05-01 MED ORDER — POLYETHYLENE GLYCOL 3350 17 G PO PACK: 17.0000 g | PACK | Freq: Every day | ORAL | 0 refills | Status: AC

## 2018-05-01 MED ORDER — HYDROCODONE-ACETAMINOPHEN 5-325 MG PO TABS: 1.0000 | ORAL_TABLET | Freq: Four times a day (QID) | ORAL | 0 refills | Status: AC | PRN

## 2018-05-01 NOTE — Telephone Encounter (Signed)
Received a message from Dr. Alen Blew to schedule an appt for the pt on 5/15 at 11am. I originally called the pt's daughter, Bryson Ha, to schedule the appt, but she told me her brother Vicente Males has been coordinating appts. I called the pt's son to schedule, but left the appt date and time on his vm for his father. I also left my direct number for him to call back and confirm the appt.

## 2018-05-01 NOTE — Clinical Social Work Placement (Signed)
Patient received and accepted bed offer at [facility]. Facility aware of patient's discharge and confirmed bed offer. [facility] contacted, patient's family notified. Patient's RN can call report to [phone] Room 3309, packet complete. CSW signing off, no other needs identified a this time.   CLINICAL SOCIAL WORK PLACEMENT  NOTE  Date:  05/01/2018  Patient Details  Name: Michael [name] MRN: [id number] Date of Birth: February 17, 1946  Clinical Social Work is seeking post-discharge placement for this patient at the [place] level of care (*CSW will initial, date and re-position this form in  chart as items are completed):  Yes   Patient/family provided with [place] Work Department's list of facilities offering this level of care within the geographic area requested by the patient (or if unable, by the patient's family).  Yes   Patient/family informed of their freedom to choose among providers that offer the needed level of care, that participate in Medicare, Medicaid or managed care program needed by the patient, have an available bed and are willing to accept the patient.  Yes   Patient/family informed of [facility]'s ownership interest in [facility], [place] and [facility], as well as of the fact that they are under no obligation to receive care at these facilities.  PASRR submitted to EDS on 04/28/18     PASRR number received on 04/28/18     Existing PASRR number confirmed on       FL2 transmitted to all facilities in geographic area requested by pt/family on 04/28/18     FL2 transmitted to all facilities within larger geographic area on       Patient informed that his/her managed care company has contracts with or will negotiate with certain facilities, including the following:        Yes   Patient/family informed of bed offers received.  Patient chooses bed at [facility]     Physician recommends and patient chooses bed at       Patient to be transferred to [facility] on 05/01/18.  Patient to be transferred to facility by [facility]     Patient family notified on 05/01/18 of transfer.  Name of family member notified:  Carlena Bjornstad     PHYSICIAN       Additional Comment:    _______________________________________________ [name], LCSW 05/01/2018, 2:43 PM

## 2018-05-01 NOTE — Discharge Summary (Signed)
Physician Discharge Summary  Michael [name] WCB:[id number] DOB: 23-Mar-1946 DOA: 04/23/2018  PCP: Dorothyann Peng, NP  Admit date: 04/23/2018 Discharge date: 05/01/2018  Admitted From: Home Disposition:  SNF  Recommendations for Outpatient Follow-up:  1. Follow up with provider at nursing home at earliest convenience with repeat CBC/BMP 2. Outpatient follow-up with oncology at earliest [place]: No  equipment/Devices: Oxygen via nasal cannula 2 to 3 L/min  Discharge Condition: Guarded CODE STATUS: Full Diet recommendation: Heart Healthy  Brief/Interim Summary: 72 year old male with history of hypertension presented on 04/23/2018 fatigue, generalized weakness and abdominal pain.  He was found to have elevated creatinine at 2.3 and hypercalcemia calcium level more than 15 he was admitted with hypercalcemia and acute kidney injury.  CT of the abdomen showed renal mass with osseous and pulmonary metastases concerning for malignancy.  He underwent left iliac bone biopsy on 04/28/2018 by IR.  Pathology showed probable metastatic renal cancer.  Oncology was consulted.  Oncology will set up outpatient follow-up.  Calcium level normalized with IV fluids.  He also required IV Lasix for fluid overload.  He is very deconditioned and will be discharged to nursing home once bed is available on oral Lasix and outpatient follow-up with oncology at earliest convenience.  Overall prognosis is guarded to poor   Discharge Diagnoses:  Principal Problem:   Hypercalcemia Active Problems:   Essential hypertension   AKI (acute kidney injury) ([place])   Metastasis ([facility])  Hypercalcemia -Probably from malignancy, -Resolved.    Outpatient follow-up  Acute metabolic encephalopathy -Probably from hypercalcemia.     -Mental status has much improved. -Scheduled narcotic has been discontinued  Left renal mass probably renal cell cancer with metastatic disease with pathological fracture of L1 and  osseous lytic lesions with pulmonary metastases -Status post left iliac bone biopsy as per urology recommendations on 04/28/2018.  Pathology is consistent with metastatic probable renal cancer.  Spoke to urology/Dr. Karsten Ro on phone who recommended oncology evaluation.  -Oncology evaluation appreciated.  Oncology will set up an outpatient follow-up with Dr. Jennefer Bravo.  Will hold off on CT chest for now because of renal function, this can be done as an outpatient by oncology if needed. -Continue pain management  Fluid overload -Status post daily intravenous Lasix doses.  We will continue Lasix 40 mg twice a day for now with a close follow-up of BMP  as an outpatient.  Acute renal failure - Creatinine improving.  Outpatient follow-up.  Hypertension -Blood pressure stable.  Monitor.  Continue amlodipine. lisinopril discontinued for now.  Arachnoid cyst- incidental finding in CT scan. Prior hospitalist discussed with neurology: monitor.      Discharge Instructions  Discharge Instructions    Ambulatory referral to Oncology   Complete by:  As directed    Metastatic renal cancer   Call MD for:  difficulty breathing, headache or visual disturbances   Complete by:  As directed    Call MD for:  extreme fatigue   Complete by:  As directed    Call MD for:  hives   Complete by:  As directed    Call MD for:  persistant dizziness or light-headedness   Complete by:  As directed    Call MD for:  persistant nausea and vomiting   Complete by:  As directed    Call MD for:  severe uncontrolled pain   Complete by:  As directed    Call MD for:  temperature >100.4   Complete by:  As directed  Diet - low sodium heart healthy   Complete by:  As directed    Increase activity slowly   Complete by:  As directed      Allergies as of 05/01/2018   No Known Allergies     Medication List    STOP taking these medications   cyclobenzaprine 10 MG tablet Commonly known as:  FLEXERIL    ibuprofen 600 MG tablet Commonly known as:  ADVIL,MOTRIN   lisinopril 40 MG tablet Commonly known as:  PRINIVIL,ZESTRIL     TAKE these medications   amLODipine 10 MG tablet Commonly known as:  NORVASC Take 1 tablet (10 mg total) by mouth daily. Start taking on:  05/02/2018   atorvastatin 20 MG tablet Commonly known as:  LIPITOR Take 1 tablet (20 mg total) by mouth daily.   COMBIGAN 0.2-0.5 % ophthalmic solution Generic drug:  brimonidine-timolol INSTILL ONE GTT IN OU Q 12 H   docusate sodium 100 MG capsule Commonly known as:  COLACE Take 2 capsules (200 mg total) by mouth daily as needed for mild constipation.   furosemide 40 MG tablet Commonly known as:  LASIX Take 1 tablet (40 mg total) by mouth 2 (two) times daily for 10 days.   HYDROcodone-acetaminophen 5-325 MG tablet Commonly known as:  NORCO/VICODIN Take 1-2 tablets by mouth every 6 (six) hours as needed for moderate pain or severe pain.   polyethylene glycol packet Commonly known as:  MIRALAX / GLYCOLAX Take 17 g by mouth daily.   tamsulosin 0.4 MG Caps capsule Commonly known as:  FLOMAX TAKE 1 CAPSULE BY MOUTH EVERY DAY   TRAVATAN Z 0.004 % Soln ophthalmic solution Generic drug:  Travoprost (BAK Free) INSTILL 1 DROP INTO BOTH EYES IN THE EVENING      Follow-up Information    Dorothyann Peng, NP. Schedule an appointment as soon as possible for a visit in 1 week(s).   Specialty:  Family Medicine Why:  with repeat CBC/BMP Contact information: [address] [place] [address] [phone]          No Known Allergies  Consultations:  Oncology/IR   Procedures/Studies: Ct Abdomen Pelvis Wo Contrast  Result Date: 04/24/2018 CLINICAL DATA:  72 year old male with elevated calcium. Evaluate for metastatic disease. EXAM: CT ABDOMEN AND PELVIS WITHOUT CONTRAST TECHNIQUE: Multidetector CT imaging of the abdomen and pelvis was performed following the standard protocol without IV contrast.  COMPARISON:  None. FINDINGS: Evaluation of this exam is limited in the absence of intravenous contrast. Lower chest: Innumerable bilateral pulmonary nodules in the visualized lung bases. Findings may be infectious in etiology, although metastatic disease is not excluded. Clinical correlation is recommended. There is mild eventration of the right hemidiaphragm with right lung base atelectasis/scarring. There is multi vessel coronary vascular calcification. No intra-abdominal free air or free fluid. Hepatobiliary: Mild diffuse fatty infiltration of the liver. No intrahepatic biliary duct dilatation. There is stone within the gallbladder. No pericholecystic fluid. Pancreas: Unremarkable. No pancreatic ductal dilatation or surrounding inflammatory changes. Spleen: Normal in size without focal abnormality. Adrenals/Urinary Tract: The adrenal glands are unremarkable. There is a 3.7 x 3.1 x 4.5 cm exophytic solid mass rising from the medial aspect of the lower pole of the left kidney. This mass is not characterized but likely represents malignancy/neoplasm versus less likely metastatic disease. Further characterization with MRI is recommended. A 2 cm low attenuating lesion in the interpolar aspect of the right kidney is not well characterized but may represent a minimally complex cyst. There is  no hydronephrosis on either side. The visualized ureters and urinary bladder appear unremarkable. Stomach/Bowel: There is no bowel obstruction or active inflammation. There is sigmoid diverticulosis without active inflammatory changes. Vascular/Lymphatic: Mild aortoiliac atherosclerotic disease. No portal venous gas. No adenopathy. Reproductive: The prostate and seminal vesicles are grossly unremarkable. Other: None Musculoskeletal: There is diffuse lytic metastatic disease throughout the spine. There is associated pathologic fracture of the superior endplate of the L1. There is focal area of lytic destruction involving the left  iliac bone measuring approximately 1.3 x 3.0 cm. There is fracture of the lateral aspect of the right seventh rib. Fracture of the lateral aspect of the left ninth, eighth ribs. IMPRESSION: 1. 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 Further characterization with MRI recommended. 2. Extensive osseous lytic metastatic disease with associated pathologic compression fracture of the L1 as well as bilateral rib fractures. 3. Innumerable small pulmonary nodules most likely metastatic disease. 4. Cholelithiasis. 5. Colonic diverticulosis. No bowel obstruction or active inflammation. Electronically Signed   By: Anner Crete M.D.   On: 04/24/2018 05:11   Dg Chest 2 View  Result Date: 04/29/2018 CLINICAL DATA:  Dyspnea and hypertension. EXAM: CHEST - 2 VIEW COMPARISON:  Chest x-ray on 04/28/2018. Prior abdominal CT on 04/24/2018. FINDINGS: Stable cardiac enlargement. Innumerable small bilateral pulmonary nodules remain present suspicious for metastatic disease. Improved aeration at the right base with some residual atelectasis remaining and elevation of the right hemidiaphragm. No significant pleural fluid. IMPRESSION: Innumerable small bilateral pulmonary nodules suspicious for diffuse metastatic disease. CT of the chest is recommended for further evaluation as only the lung bases were included on the prior abdomen CT. Aeration at the right lung base appears improved since the prior chest x-ray. Electronically Signed   By: Aletta Edouard M.D.   On: 04/29/2018 21:08   Ct Head Wo Contrast  Result Date: 04/25/2018 CLINICAL DATA:  Altered mental status.  Agitation. EXAM: CT HEAD WITHOUT CONTRAST TECHNIQUE: Contiguous axial images were obtained from the base of the skull through the vertex without intravenous contrast. COMPARISON:  None. FINDINGS: Brain: Basal ganglia are intact bilaterally. The insular ribbon is normal. Hypoattenuation involving the right temporal tip is  likely artifactual with some beam hardening artifact. The brainstem and cerebellum are normal. 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 No acute infarct, hemorrhage, or mass lesion is present. The ventricles are of normal size. No other significant extra-axial fluid collection is present. Vascular: No hyperdense vessel or unexpected calcification. Skull: Calvarium is intact. No focal lytic or blastic lesions are present. Sinuses/Orbits: The paranasal sinuses and mastoid air cells are clear. Left lens replacement is noted. Globes and orbits are otherwise within normal limits. IMPRESSION: 1. No acute or focal lesion to explain the patient's symptoms. 2. Mild atrophy and white matter disease. 3. 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 Electronically Signed   By: San Morelle M.D.   On: 04/25/2018 13:16   Ct Biopsy  Result Date: 04/28/2018 INDICATION: 72 year old male with a history of metastatic bone lesions. He has been referred for a targeted bone lesion biopsy EXAM: CT BIOPSY MEDICATIONS: None. ANESTHESIA/SEDATION: Moderate (conscious) sedation was employed during this procedure. A total of Versed 1.5 mg and Fentanyl 75 mcg was administered intravenously. Moderate Sedation Time: 14 minutes. The patient's level of consciousness and vital signs were monitored continuously by radiology nursing throughout  the procedure under my direct supervision. FLUOROSCOPY TIME:  CT COMPLICATIONS: None PROCEDURE: The procedure risks, benefits, and alternatives were explained to the patient. Questions regarding the procedure were encouraged and answered. The patient understands and consents to the procedure. Scout CT of the pelvis was performed for surgical planning purposes. The posterior pelvis was prepped with chlorhexidinein a sterile fashion, and a sterile drape was applied covering the operative field.  A sterile gown and sterile gloves were used for the procedure. Local anesthesia was provided with 1% Lidocaine. We targeted the lesion in the posterior left iliac bone for biopsy. The skin and subcutaneous tissues were infiltrated with 1% lidocaine without epinephrine. A small stab incision was made with an 11 blade scalpel, and an 11 gauge Murphy needle was advanced with CT guidance to the posterior cortex. Manual forced was used to advance the needle through the posterior cortex and the stylet was removed. Once the needle was confirmed just proximal to the soft tissue lesion of the left iliac bone. A coaxial 17 gauge needle was advanced. Multiple 18 gauge core biopsy were then achieved and placed into formalin. The coaxial needle was then removed, and 11 gauge core biopsy was performed of the soft tissue lesion/lytic lesion with the [facility] needle. Manual pressure was used for hemostasis and a sterile dressing was placed. No complications were encountered no significant blood loss was encountered. Patient tolerated the procedure well and remained hemodynamically stable throughout. IMPRESSION: Status post targeted bone lesion biopsy of the left posterior iliac bone. Signed, Dulcy Fanny. Earleen Newport, DO [facility] Radiology Electronically Signed   By: Corrie Mckusick D.O.   On: 04/28/2018 09:41   Dg Chest Port 1 View  Result Date: 04/28/2018 CLINICAL DATA:  Shortness of breath, peripheral edema, left-sided chest pain. History of hypertension, acute kidney injury, nonsmoker. EXAM: PORTABLE CHEST 1 VIEW COMPARISON:  Chest x-ray of October 07, 2011 FINDINGS: The lungs are mildly hypoinflated. The interstitial markings are coarse. There is a small right pleural effusion. There is new right basilar density. The cardiac silhouette is enlarged. There is calcification in the wall of the aortic arch. The pulmonary vascularity is engorged and indistinct. The observed bony thorax is  unremarkable. IMPRESSION: CHF with mild interstitial edema. Right basilar atelectasis or pneumonia with small right pleural effusion. Thoracic aortic atherosclerosis. Electronically Signed   By: David  Martinique M.D.   On: 04/28/2018 11:35    IR guided left iliac bone biopsy on 04/28/2018   Subjective: Patient seen and examined at bedside.  He is awake and denies any overnight fever, nausea, vomiting or chest pain.  Discharge Exam: Vitals:   04/30/18 2201 05/01/18 0629  BP: 124/66 137/81  Pulse: 91 91  Resp: 19 18  Temp: (!) 97.4 F (36.3 C) 98.4 F (36.9 C)  SpO2: 97% 98%   Vitals:   04/30/18 0605 04/30/18 1432 04/30/18 2201 05/01/18 0629  BP: 129/76 125/69 124/66 137/81  Pulse: 92 (!) 101 91 91  Resp: 18 18 19 18   Temp: 98 F (36.7 C) 99.1 F (37.3 C) (!) 97.4 F (36.3 C) 98.4 F (36.9 C)  TempSrc: Oral Oral Oral Oral  SpO2: 100% 94% 97% 98%  Weight: 130.2 kg (287 lb 0.6 oz)   127.6 kg (281 lb 6.4 oz)  Height:        General: Pt is alert, awake, not in acute distress Cardiovascular: Rate controlled, S1/S2 + Respiratory: Bilateral decreased breath sounds at bases Abdominal: Soft, NT, ND, bowel  sounds + Extremities: 1-2+ edema , no cyanosis    The results of significant diagnostics from this hospitalization (including imaging, microbiology, ancillary and laboratory) are listed below for reference.     Microbiology: No results found for this or any previous visit (from the past 240 hour(s)).   Labs: BNP (last 3 results) No results for input(s): BNP in the last 8760 hours. Basic Metabolic Panel: Recent Labs  Lab 04/27/18 0514 04/28/18 0509 04/29/18 0506 04/30/18 0426 05/01/18 0434  NA 141 141 142 144 142  K 4.1 3.7 3.5 3.5 3.5  CL 110 110 108 106 104  CO2 20* 20* 21* 25 26  GLUCOSE 98 104* 107* 100* 113*  BUN 27* 25* 25* 26* 27*  CREATININE 1.61* 1.47* 1.46* 1.40* 1.35*  CALCIUM 9.0 8.4* 7.9* 7.9* 7.6*  MG  --  1.4*  --  1.7 1.6*   Liver Function  Tests: Recent Labs  Lab 04/26/18 0442 04/27/18 0514 04/28/18 0509 04/28/18 1724 04/30/18 0426  AST  --  27  --   --  67*  ALT  --  24  --   --  61  ALKPHOS  --  218*  --   --  340*  BILITOT  --  0.7  --   --  1.5*  PROT  --  5.6*  --   --  5.7*  ALBUMIN 2.4* 2.6* 2.4* 2.5* 2.6*   No results for input(s): LIPASE, AMYLASE in the last 168 hours. No results for input(s): AMMONIA in the last 168 hours. CBC: Recent Labs  Lab 04/25/18 0446 04/27/18 0514 04/28/18 0503 04/29/18 0506 04/30/18 0426 05/01/18 0434  WBC 4.8 6.0 5.8 6.4 6.7 7.7  NEUTROABS 4.1 4.1  --  4.2 4.6 5.6  HGB 9.6* 10.6* 10.0* 10.1* 10.1* 10.2*  HCT 29.3* 32.5* 30.3* 30.3* 30.3* 30.7*  MCV 91.0 89.5 88.6 88.1 88.3 87.7  PLT 218 196 188 167 167 203   Cardiac Enzymes: No results for input(s): CKTOTAL, CKMB, CKMBINDEX, TROPONINI in the last 168 hours. BNP: Invalid input(s): POCBNP CBG: No results for input(s): GLUCAP in the last 168 hours. D-Dimer No results for input(s): DDIMER in the last 72 hours. Hgb A1c No results for input(s): HGBA1C in the last 72 hours. Lipid Profile No results for input(s): CHOL, HDL, LDLCALC, TRIG, CHOLHDL, LDLDIRECT in the last 72 hours. Thyroid function studies No results for input(s): TSH, T4TOTAL, T3FREE, THYROIDAB in the last 72 hours.  Invalid input(s): FREET3 Anemia work up No results for input(s): VITAMINB12, FOLATE, FERRITIN, TIBC, IRON, RETICCTPCT in the last 72 hours. Urinalysis    Component Value Date/Time   COLORURINE YELLOW 04/28/2018 [place] 04/28/2018 1548   LABSPEC 1.016 04/28/2018 1548   PHURINE 5.0 04/28/2018 1548   GLUCOSEU NEGATIVE 04/28/2018 1548   HGBUR NEGATIVE 04/28/2018 [place] 04/28/2018 1548   BILIRUBINUR n 05/16/2016 [place] 04/28/2018 1548   PROTEINUR NEGATIVE 04/28/2018 1548   UROBILINOGEN 0.2 05/16/2016 1046   NITRITE NEGATIVE 04/28/2018 1548   LEUKOCYTESUR NEGATIVE 04/28/2018 1548    Sepsis Labs Invalid input(s): PROCALCITONIN,  WBC,  LACTICIDVEN Microbiology No results found for this or any previous visit (from the past 240 hour(s)).   Time coordinating discharge: 35 minutes  SIGNED:   Aline August, MD  [facility] 05/01/2018, 10:45 AM Pager: [phone]  If 7PM-7AM, please contact night-coverage [email or web address] Password [email or web address]

## 2018-05-04 DIAGNOSIS — C7951 Secondary malignant neoplasm of bone: Secondary | ICD-10-CM | POA: Diagnosis not present

## 2018-05-04 DIAGNOSIS — C642 Malignant neoplasm of left kidney, except renal pelvis: Secondary | ICD-10-CM | POA: Diagnosis not present

## 2018-05-04 DIAGNOSIS — C7800 Secondary malignant neoplasm of unspecified lung: Secondary | ICD-10-CM | POA: Diagnosis not present

## 2018-05-04 LAB — PTH-RELATED PEPTIDE

## 2018-05-06 ENCOUNTER — Inpatient Hospital Stay: Payer: Medicare HMO | Attending: Oncology | Admitting: Oncology

## 2018-05-06 ENCOUNTER — Telehealth: Payer: Self-pay

## 2018-05-06 VITALS — BP 124/71 | HR 93 | Temp 98.8°F | Resp 18 | Ht 72.0 in | Wt 281.8 lb

## 2018-05-06 DIAGNOSIS — C7800 Secondary malignant neoplasm of unspecified lung: Secondary | ICD-10-CM | POA: Insufficient documentation

## 2018-05-06 DIAGNOSIS — C642 Malignant neoplasm of left kidney, except renal pelvis: Secondary | ICD-10-CM | POA: Insufficient documentation

## 2018-05-06 DIAGNOSIS — C7951 Secondary malignant neoplasm of bone: Secondary | ICD-10-CM | POA: Insufficient documentation

## 2018-05-06 DIAGNOSIS — M549 Dorsalgia, unspecified: Secondary | ICD-10-CM | POA: Insufficient documentation

## 2018-05-06 DIAGNOSIS — C649 Malignant neoplasm of unspecified kidney, except renal pelvis: Secondary | ICD-10-CM

## 2018-05-06 NOTE — Progress Notes (Signed)
Reason for Referral: Kidney cancer.  HPI: 72 year old gentleman currently of [place] where he lived the majority of his life.  He was in his usual state of health until he presented on Apr 23, 2018 with fatigue, abdominal pain and altered mental status.  He was found to have creatinine of 2.3 and hypercalcemia.  His evaluation included CT scan of the abdomen and pelvis which showed a renal mass measuring 3.7 x 3.1 x 4.5 arising from the medial aspect of the left kidney. Osseous metastasis extensively including lytic lesions and pathological fractures of L1 and bilateral rib fractures.  He also has suggestion of a pulmonary metastasis as well.  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  He was treated with aggressive IV hydration with normalization of his calcium and improvement in his kidney function.  He was discharged to skilled nursing facility last week.  This is discharge, he is still requiring a lot of assistance with mobility although he is getting aggressive physical therapy.  He does not report any chest pain or shoulder pain.  He does report back pain when he is trying to sleep at night.  His appetite has been reasonable although his eating has been erratic because of quality of food.  He does not report any headaches, blurry vision, syncope or seizures. Does not report any fevers, chills or sweats.  Does not report any cough, wheezing or hemoptysis.  Does not report any chest pain, palpitation, orthopnea or leg edema.  Does not report any nausea, vomiting or abdominal pain.  Does not report any constipation or diarrhea.  Does not report any skeletal complaints.    Does not report frequency, urgency or hematuria.  Does not report any skin rashes or lesions. Does not report any heat or cold intolerance.  Does not report any lymphadenopathy or petechiae.  Does not report any anxiety or depression.  Remaining  review of systems is negative.    Past Medical History:  Diagnosis Date  . Glaucoma   . High blood pressure   . Hyperlipidemia   . Morbid obesity ([place])   :  Past Surgical History:  Procedure Laterality Date  . cataract surgery    :   Current Outpatient Medications:  .  amLODipine (NORVASC) 10 MG tablet, Take 1 tablet (10 mg total) by mouth daily., Disp: 30 tablet, Rfl: 0 .  atorvastatin (LIPITOR) 20 MG tablet, Take 1 tablet (20 mg total) by mouth daily., Disp: 90 tablet, Rfl: 3 .  COMBIGAN 0.2-0.5 % ophthalmic solution, INSTILL ONE GTT IN OU Q 12 H, Disp: , Rfl: 5 .  docusate sodium (COLACE) 100 MG capsule, Take 2 capsules (200 mg total) by mouth daily as needed for mild constipation., Disp: 20 capsule, Rfl: 0 .  furosemide (LASIX) 40 MG tablet, Take 1 tablet (40 mg total) by mouth 2 (two) times daily for 10 days., Disp: 20 tablet, Rfl: 0 .  HYDROcodone-acetaminophen (NORCO/VICODIN) 5-325 MG tablet, Take 1-2 tablets by mouth every 6 (six) hours as needed for moderate pain or severe pain., Disp: 14 tablet, Rfl: 0 .  polyethylene glycol (MIRALAX / GLYCOLAX) packet, Take 17 g by mouth daily., Disp: 14 each, Rfl: 0 .  tamsulosin (FLOMAX) 0.4 MG CAPS capsule, TAKE 1 CAPSULE BY MOUTH EVERY DAY, Disp: 90 capsule, Rfl: 0 .  TRAVATAN Z 0.004 % SOLN ophthalmic solution, INSTILL 1 DROP INTO BOTH EYES IN THE EVENING, Disp: ,  Rfl: 3:  No Known Allergies:  Family History  Problem Relation Age of Onset  . Cancer Mother        Larynx   :  Social History   Socioeconomic History  . Marital status: Single    Spouse name: Not on file  . Number of children: Not on file  . Years of education: Not on file  . Highest education level: Not on file  Occupational History  . Not on file  Social Needs  . Financial resource strain: Not on file  . Food insecurity:    Worry: Not on file    Inability: Not on file  . Transportation needs:    Medical: Not on file    Non-medical: Not on file   Tobacco Use  . Smoking status: Never Smoker  . Smokeless tobacco: Never Used  Substance and Sexual Activity  . Alcohol use: Yes    Alcohol/week: 0.0 oz  . Drug use: No  . Sexual activity: Not on file  Lifestyle  . Physical activity:    Days per week: Not on file    Minutes per session: Not on file  . Stress: Not on file  Relationships  . Social connections:    Talks on phone: Not on file    Gets together: Not on file    Attends religious service: Not on file    Active member of club or organization: Not on file    Attends meetings of clubs or organizations: Not on file    Relationship status: Not on file  . Intimate partner violence:    Fear of current or ex partner: Not on file    Emotionally abused: Not on file    Physically abused: Not on file    Forced sexual activity: Not on file  Other Topics Concern  . Not on file  Social History Narrative   Works for [employer] married    Three children ( [place], [place], [place])   :  Pertinent items are noted in HPI.  Exam: Blood pressure 124/71, pulse 93, temperature 98.8 F (37.1 C), temperature source Oral, resp. rate 18, height 6' (1.829 m), weight 281 lb 12.8 oz (127.8 kg), SpO2 100 %.   ECOG 2 General appearance: alert and cooperative appeared without distress. Head: atraumatic without any abnormalities. Eyes: conjunctivae/corneas clear. PERRL.  Sclera anicteric. Throat: lips, mucosa, and tongue normal; without oral thrush or ulcers. Resp: clear to auscultation bilaterally without rhonchi, wheezes or dullness to percussion. Cardio: regular rate and rhythm, S1, S2 normal, no murmur, click, rub or gallop GI: soft, non-tender; bowel sounds normal; no masses,  no organomegaly Skin: Skin color, texture, turgor normal. No rashes or lesions Lymph nodes: Cervical, supraclavicular, and axillary nodes normal. Neurologic: Grossly normal without any motor, sensory or deep tendon reflexes. Musculoskeletal: No joint  deformity or effusion.  CBC    Component Value Date/Time   WBC 7.7 05/01/2018 0434   RBC 3.50 (L) 05/01/2018 0434   HGB 10.2 (L) 05/01/2018 0434   HCT 30.7 (L) 05/01/2018 0434   PLT 203 05/01/2018 0434   MCV 87.7 05/01/2018 0434   MCH 29.1 05/01/2018 0434   MCHC 33.2 05/01/2018 0434   RDW 14.0 05/01/2018 0434   LYMPHSABS 1.2 05/01/2018 0434   MONOABS 0.8 05/01/2018 0434   EOSABS 0.1 05/01/2018 0434   BASOSABS 0.0 05/01/2018 0434     Chemistry      Component Value Date/Time   NA 142 05/01/2018  0434   K 3.5 05/01/2018 0434   CL 104 05/01/2018 0434   CO2 26 05/01/2018 0434   BUN 27 (H) 05/01/2018 0434   CREATININE 1.35 (H) 05/01/2018 0434      Component Value Date/Time   CALCIUM 7.6 (L) 05/01/2018 0434   CALCIUM 15.1 (HH) 04/23/2018 2357   ALKPHOS 340 (H) 04/30/2018 0426   AST 67 (H) 04/30/2018 0426   ALT 61 04/30/2018 0426   BILITOT 1.5 (H) 04/30/2018 0426       Ct Abdomen Pelvis Wo Contrast  Result Date: 04/24/2018 CLINICAL DATA:  72 year old male with elevated calcium. Evaluate for metastatic disease. EXAM: CT ABDOMEN AND PELVIS WITHOUT CONTRAST TECHNIQUE: Multidetector CT imaging of the abdomen and pelvis was performed following the standard protocol without IV contrast. COMPARISON:  None. FINDINGS: Evaluation of this exam is limited in the absence of intravenous contrast. Lower chest: Innumerable bilateral pulmonary nodules in the visualized lung bases. Findings may be infectious in etiology, although metastatic disease is not excluded. Clinical correlation is recommended. There is mild eventration of the right hemidiaphragm with right lung base atelectasis/scarring. There is multi vessel coronary vascular calcification. No intra-abdominal free air or free fluid. Hepatobiliary: Mild diffuse fatty infiltration of the liver. No intrahepatic biliary duct dilatation. There is stone within the gallbladder. No pericholecystic fluid. Pancreas: Unremarkable. No pancreatic ductal  dilatation or surrounding inflammatory changes. Spleen: Normal in size without focal abnormality. Adrenals/Urinary Tract: The adrenal glands are unremarkable. There is a 3.7 x 3.1 x 4.5 cm exophytic solid mass rising from the medial aspect of the lower pole of the left kidney. This mass is not characterized but likely represents malignancy/neoplasm versus less likely metastatic disease. Further characterization with MRI is recommended. A 2 cm low attenuating lesion in the interpolar aspect of the right kidney is not well characterized but may represent a minimally complex cyst. There is no hydronephrosis on either side. The visualized ureters and urinary bladder appear unremarkable. Stomach/Bowel: There is no bowel obstruction or active inflammation. There is sigmoid diverticulosis without active inflammatory changes. Vascular/Lymphatic: Mild aortoiliac atherosclerotic disease. No portal venous gas. No adenopathy. Reproductive: The prostate and seminal vesicles are grossly unremarkable. Other: None Musculoskeletal: There is diffuse lytic metastatic disease throughout the spine. There is associated pathologic fracture of the superior endplate of the L1. There is focal area of lytic destruction involving the left iliac bone measuring approximately 1.3 x 3.0 cm. There is fracture of the lateral aspect of the right seventh rib. Fracture of the lateral aspect of the left ninth, eighth ribs. IMPRESSION: 1. 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 Further characterization with MRI recommended. 2. Extensive osseous lytic metastatic disease with associated pathologic compression fracture of the L1 as well as bilateral rib fractures. 3. Innumerable small pulmonary nodules most likely metastatic disease. 4. Cholelithiasis. 5. Colonic diverticulosis. No bowel obstruction or active inflammation. Electronically Signed   By: Anner Crete M.D.   On: 04/24/2018 05:11   Dg Chest 2  View  Result Date: 05/01/2018 CLINICAL DATA:  Increased shortness of breath. Mid chest pain since this morning. EXAM: CHEST - 2 VIEW COMPARISON:  04/29/2018 and 04/28/2018. FINDINGS: Stable enlarged cardiac silhouette and prominence of the interstitial markings. Decreased prominence of the pulmonary vasculature. Improved inspiration. Small right pleural effusion. Hiatal hernia. Unremarkable bones. IMPRESSION: 1. Improved pulmonary vascular congestion. 2. Stable cardiomegaly, chronic interstitial lung disease and probable interstitial pulmonary edema. 3. Small right pleural effusion. 4. Small to moderate-sized  hiatal hernia. Electronically Signed   By: Claudie Revering M.D.   On: 05/01/2018 12:45   Dg Chest 2 View  Result Date: 04/29/2018 CLINICAL DATA:  Dyspnea and hypertension. EXAM: CHEST - 2 VIEW COMPARISON:  Chest x-ray on 04/28/2018. Prior abdominal CT on 04/24/2018. FINDINGS: Stable cardiac enlargement. Innumerable small bilateral pulmonary nodules remain present suspicious for metastatic disease. Improved aeration at the right base with some residual atelectasis remaining and elevation of the right hemidiaphragm. No significant pleural fluid. IMPRESSION: Innumerable small bilateral pulmonary nodules suspicious for diffuse metastatic disease. CT of the chest is recommended for further evaluation as only the lung bases were included on the prior abdomen CT. Aeration at the right lung base appears improved since the prior chest x-ray. Electronically Signed   By: Aletta Edouard M.D.   On: 04/29/2018 21:08   Ct Head Wo Contrast    Assessment and Plan:   72 year old gentleman with the following issues:  1.  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  This was diagnosed in May 2019 with a tissue biopsy of the left iliac bone lesion.  This indicates stage IV disease although complete staging will be needed at this time.  The  natural course of this disease was discussed today with the patient and his family.  He has an incurable malignancy that is has a lot of poor prognostic features including his hypercalcemia, poor performance status and anemia.  These poor risk features indicate limited life expectancy and decreased success of anticancer treatment.  Treatment options at this time include combination of immunotherapy agents, immunotherapy and oral targeted therapy combination and single agent oral targeted therapy.  The rationale and risks and benefits of all these approaches were discussed today in detail.  After discussion today, I feel his best option would be combination of ipilimumab and Nivolumab.  Risks and benefits with this regimen was discussed today in detail.  Complications include nausea, fatigue, thyroid disease, pneumonitis, colitis and arthritis were reviewed.  The benefit would be improvement in overall survival as well as palliation of symptoms.  After discussion today he wants to discuss this with his family further and after completing his staging work-up we will decide whether to proceed with anticancer therapy or not.  2.  Prognosis: This was discussed today in detail.  He has an incurable malignancy that disease that will be palliated potentially with this treatment.  With treatment I anticipate overall survival in the double digit months which could be less than that depending on his response to therapy.  Without treatment his disease will take over his body rather quickly likely require hospice in the next 3 to 6 months.  The patient's family is concerned about his independence and his ability to function living by himself.  I share the concern with him at this time and I feel that he would likely not able to live independently moving forward.  3.  Hypercalcemia: I will check his calcium in the next 2 weeks.  Risks and benefits of Zometa was discussed to decrease his skeletal related events and  potential complications in the future of hyperglycemia.  We will set up the infusion with the next visit in 2 weeks.  4.  Follow-up: We will be in 2 weeks after obtaining staging work-up and will discuss his decision regarding pursuing treatment at the time.  60  minutes was spent with the patient face-to-face today.  More than 50%  of time was dedicated to patient counseling, education and coordination of the patient's multifaceted care.

## 2018-05-06 NOTE — Telephone Encounter (Signed)
Patient ct and scan was set up before patient left. Per 5/15 los

## 2018-05-06 NOTE — Telephone Encounter (Signed)
Printed avs and calender of upcoming appointment. Per 5/15 los. Gave patient ct information to call. How ever the patient is in a skilled facility.

## 2018-05-08 DIAGNOSIS — C649 Malignant neoplasm of unspecified kidney, except renal pelvis: Secondary | ICD-10-CM | POA: Diagnosis not present

## 2018-05-08 DIAGNOSIS — N179 Acute kidney failure, unspecified: Secondary | ICD-10-CM | POA: Diagnosis not present

## 2018-05-08 DIAGNOSIS — I10 Essential (primary) hypertension: Secondary | ICD-10-CM | POA: Diagnosis not present

## 2018-05-08 DIAGNOSIS — L859 Epidermal thickening, unspecified: Secondary | ICD-10-CM | POA: Diagnosis not present

## 2018-05-08 DIAGNOSIS — R739 Hyperglycemia, unspecified: Secondary | ICD-10-CM | POA: Diagnosis not present

## 2018-05-11 DIAGNOSIS — I10 Essential (primary) hypertension: Secondary | ICD-10-CM | POA: Diagnosis not present

## 2018-05-11 DIAGNOSIS — C642 Malignant neoplasm of left kidney, except renal pelvis: Secondary | ICD-10-CM | POA: Diagnosis not present

## 2018-05-11 DIAGNOSIS — C7951 Secondary malignant neoplasm of bone: Secondary | ICD-10-CM | POA: Diagnosis not present

## 2018-05-12 ENCOUNTER — Other Ambulatory Visit: Payer: Self-pay | Admitting: Oncology

## 2018-05-12 DIAGNOSIS — C642 Malignant neoplasm of left kidney, except renal pelvis: Secondary | ICD-10-CM | POA: Diagnosis not present

## 2018-05-12 DIAGNOSIS — C7951 Secondary malignant neoplasm of bone: Secondary | ICD-10-CM | POA: Diagnosis not present

## 2018-05-12 DIAGNOSIS — C7800 Secondary malignant neoplasm of unspecified lung: Secondary | ICD-10-CM | POA: Diagnosis not present

## 2018-05-12 DIAGNOSIS — I10 Essential (primary) hypertension: Secondary | ICD-10-CM | POA: Diagnosis not present

## 2018-05-15 ENCOUNTER — Encounter (HOSPITAL_COMMUNITY)
Admission: RE | Admit: 2018-05-15 | Discharge: 2018-05-15 | Disposition: A | Discharge status: Home / Self Care | Payer: Medicare HMO | Source: Ambulatory Visit | Attending: Oncology | Admitting: Oncology

## 2018-05-15 DIAGNOSIS — C649 Malignant neoplasm of unspecified kidney, except renal pelvis: Secondary | ICD-10-CM | POA: Insufficient documentation

## 2018-05-15 MED ORDER — TECHNETIUM TC 99M MEDRONATE IV KIT
21.2000 | PACK | Freq: Once | INTRAVENOUS | Status: AC
  Administered 2018-05-15: 21.2 via INTRAVENOUS

## 2018-05-19 ENCOUNTER — Telehealth: Payer: Self-pay | Admitting: *Deleted

## 2018-05-19 DIAGNOSIS — I10 Essential (primary) hypertension: Secondary | ICD-10-CM | POA: Diagnosis not present

## 2018-05-19 DIAGNOSIS — C7800 Secondary malignant neoplasm of unspecified lung: Secondary | ICD-10-CM | POA: Diagnosis not present

## 2018-05-19 DIAGNOSIS — C7951 Secondary malignant neoplasm of bone: Secondary | ICD-10-CM | POA: Diagnosis not present

## 2018-05-19 DIAGNOSIS — C642 Malignant neoplasm of left kidney, except renal pelvis: Secondary | ICD-10-CM | POA: Diagnosis not present

## 2018-05-19 NOTE — Telephone Encounter (Signed)
Please let her know that I will call her today late afternoon.

## 2018-05-19 NOTE — Telephone Encounter (Signed)
Received voice mail message from daughter, Bryson Ha, who would like to speak directly to Dr. Alen Blew. Return number is [phone].

## 2018-05-20 ENCOUNTER — Non-Acute Institutional Stay: Payer: Medicare HMO | Admitting: Hospice and Palliative Medicine

## 2018-05-20 DIAGNOSIS — Z515 Encounter for palliative care: Secondary | ICD-10-CM | POA: Diagnosis not present

## 2018-05-21 ENCOUNTER — Inpatient Hospital Stay (HOSPITAL_BASED_OUTPATIENT_CLINIC_OR_DEPARTMENT_OTHER): Payer: Medicare HMO | Admitting: Oncology

## 2018-05-21 ENCOUNTER — Inpatient Hospital Stay: Payer: Medicare HMO

## 2018-05-21 VITALS — BP 119/72 | HR 102 | Resp 18

## 2018-05-21 DIAGNOSIS — C7951 Secondary malignant neoplasm of bone: Secondary | ICD-10-CM

## 2018-05-21 DIAGNOSIS — C642 Malignant neoplasm of left kidney, except renal pelvis: Secondary | ICD-10-CM

## 2018-05-21 DIAGNOSIS — C7800 Secondary malignant neoplasm of unspecified lung: Secondary | ICD-10-CM

## 2018-05-21 DIAGNOSIS — R0902 Hypoxemia: Secondary | ICD-10-CM | POA: Diagnosis not present

## 2018-05-21 DIAGNOSIS — C649 Malignant neoplasm of unspecified kidney, except renal pelvis: Secondary | ICD-10-CM

## 2018-05-21 DIAGNOSIS — M549 Dorsalgia, unspecified: Secondary | ICD-10-CM | POA: Diagnosis not present

## 2018-05-21 LAB — CMP (CANCER CENTER ONLY)
ALK PHOS: 402 U/L — AB (ref 40–150)
ALT: 18 U/L (ref 0–55)
ANION GAP: 13 — AB (ref 3–11)
AST: 24 U/L (ref 5–34)
Albumin: 2.4 g/dL — ABNORMAL LOW (ref 3.5–5.0)
BILIRUBIN TOTAL: 0.6 mg/dL (ref 0.2–1.2)
BUN: 57 mg/dL — ABNORMAL HIGH (ref 7–26)
CO2: 28 mmol/L (ref 22–29)
CREATININE: 1.47 mg/dL — AB (ref 0.70–1.30)
Calcium: 10.8 mg/dL — ABNORMAL HIGH (ref 8.4–10.4)
Chloride: 97 mmol/L — ABNORMAL LOW (ref 98–109)
GFR, EST AFRICAN AMERICAN: 54 mL/min — AB (ref >60)
GFR, Estimated: 47 mL/min — ABNORMAL LOW (ref >60)
Glucose, Bld: 94 mg/dL (ref 70–140)
Potassium: 6 mmol/L — ABNORMAL HIGH (ref 3.5–5.1)
Sodium: 138 mmol/L (ref 136–145)
Total Protein: 6.3 g/dL — ABNORMAL LOW (ref 6.4–8.3)

## 2018-05-21 LAB — CBC WITH DIFFERENTIAL (CANCER CENTER ONLY)
Basophils Absolute: 0.1 10*3/uL (ref 0.0–0.1)
Basophils Relative: 1 %
EOS PCT: 1 %
Eosinophils Absolute: 0.1 10*3/uL (ref 0.0–0.5)
HCT: 27.6 % — ABNORMAL LOW (ref 38.4–49.9)
HEMOGLOBIN: 8.8 g/dL — AB (ref 13.0–17.1)
LYMPHS ABS: 1.3 10*3/uL (ref 0.9–3.3)
LYMPHS PCT: 23 %
MCH: 28 pg (ref 27.2–33.4)
MCHC: 31.9 g/dL — AB (ref 32.0–36.0)
MCV: 87.9 fL (ref 79.3–98.0)
MONO ABS: 0.9 10*3/uL (ref 0.1–0.9)
Monocytes Relative: 16 %
NEUTROS ABS: 3.3 10*3/uL (ref 1.5–6.5)
Neutrophils Relative %: 59 %
Platelet Count: 194 10*3/uL (ref 140–400)
RBC: 3.14 MIL/uL — ABNORMAL LOW (ref 4.20–5.82)
RDW: 14.9 % — AB (ref 11.0–14.6)
WBC Count: 5.7 10*3/uL (ref 4.0–10.3)
nRBC: 1 /100 WBC — ABNORMAL HIGH

## 2018-05-21 MED ORDER — FENTANYL 25 MCG/HR TD PT72: 25.0000 ug | MEDICATED_PATCH | TRANSDERMAL | 0 refills | Status: AC

## 2018-05-21 MED ORDER — OXYCODONE-ACETAMINOPHEN 5-325 MG PO TABS
ORAL_TABLET | ORAL | Status: AC
  Filled 2018-05-21: qty 2

## 2018-05-21 MED ORDER — HYDROCODONE-ACETAMINOPHEN 10-325 MG PO TABS: 1.0000 | ORAL_TABLET | Freq: Four times a day (QID) | ORAL | 0 refills | Status: AC | PRN

## 2018-05-21 MED ORDER — SODIUM CHLORIDE 0.9 % IV SOLN
Freq: Once | INTRAVENOUS | Status: AC
  Administered 2018-05-21: 15:00:00 via INTRAVENOUS

## 2018-05-21 MED ORDER — ZOLEDRONIC ACID 4 MG/100ML IV SOLN
4.0000 mg | Freq: Once | INTRAVENOUS | Status: AC
  Administered 2018-05-21: 4 mg via INTRAVENOUS
  Filled 2018-05-21: qty 100

## 2018-05-21 MED ORDER — OXYCODONE-ACETAMINOPHEN 5-325 MG PO TABS
2.0000 | ORAL_TABLET | Freq: Once | ORAL | Status: AC
  Administered 2018-05-21: 2 via ORAL

## 2018-05-21 NOTE — Progress Notes (Signed)
Family was made aware of the pick up time of the transport by 6:30, pt was accompanied by transport via wheelchair, pt alert and oriented pt's watch was placed on his left arm.

## 2018-05-21 NOTE — Patient Instructions (Addendum)
[facility] Discharge Instructions for Patients Receiving Chemotherapy  Today you received the following chemotherapy agents  Zometa  To help prevent nausea and vomiting after your treatment, we encourage you to take your nausea medication as directed   If you develop nausea and vomiting that is not controlled by your nausea medication, call the clinic.   BELOW ARE SYMPTOMS THAT SHOULD BE REPORTED IMMEDIATELY:  *FEVER GREATER THAN 100.5 F  *CHILLS WITH OR WITHOUT FEVER  NAUSEA AND VOMITING THAT IS NOT CONTROLLED WITH YOUR NAUSEA MEDICATION  *UNUSUAL SHORTNESS OF BREATH  *UNUSUAL BRUISING OR BLEEDING  TENDERNESS IN MOUTH AND THROAT WITH OR WITHOUT PRESENCE OF ULCERS  *URINARY PROBLEMS  *BOWEL PROBLEMS  UNUSUAL RASH Items with * indicate a potential emergency and should be followed up as soon as possible.  Feel free to call the clinic should you have any questions or concerns. The clinic phone number is [phone].  Please show the [place] at check-in to the Emergency Department and triage nurse.   Zoledronic Acid injection (Hypercalcemia, Oncology) What is this medicine? ZOLEDRONIC ACID (ZOE le dron ik AS id) lowers the amount of calcium loss from bone. It is used to treat too much calcium in your blood from cancer. It is also used to prevent complications of cancer that has spread to the bone. This medicine may be used for other purposes; ask your health care provider or pharmacist if you have questions. COMMON BRAND NAME(S): Zometa What should I tell my health care provider before I take this medicine? They need to know if you have any of these conditions: -aspirin-sensitive asthma -cancer, especially if you are receiving medicines used to treat cancer -dental disease or wear dentures -infection -kidney disease -receiving corticosteroids like dexamethasone or prednisone -an unusual or allergic reaction to zoledronic acid, other medicines,  foods, dyes, or preservatives -pregnant or trying to get pregnant -breast-feeding How should I use this medicine? This medicine is for infusion into a vein. It is given by a health care professional in a hospital or clinic setting. Talk to your pediatrician regarding the use of this medicine in children. Special care may be needed. Overdosage: If you think you have taken too much of this medicine contact a poison control center or emergency room at once. NOTE: This medicine is only for you. Do not share this medicine with others. What if I miss a dose? It is important not to miss your dose. Call your doctor or health care professional if you are unable to keep an appointment. What may interact with this medicine? -certain antibiotics given by injection -NSAIDs, medicines for pain and inflammation, like ibuprofen or naproxen -some diuretics like bumetanide, furosemide -teriparatide -thalidomide This list may not describe all possible interactions. Give your health care provider a list of all the medicines, herbs, non-prescription drugs, or dietary supplements you use. Also tell them if you smoke, drink alcohol, or use illegal drugs. Some items may interact with your medicine. What should I watch for while using this medicine? Visit your doctor or health care professional for regular checkups. It may be some time before you see the benefit from this medicine. Do not stop taking your medicine unless your doctor tells you to. Your doctor may order blood tests or other tests to see how you are doing. Women should inform their doctor if they wish to become pregnant or think they might be pregnant. There is a potential for serious side effects to an unborn child.  Talk to your health care professional or pharmacist for more information. You should make sure that you get enough calcium and vitamin D while you are taking this medicine. Discuss the foods you eat and the vitamins you take with your health  care professional. Some people who take this medicine have severe bone, joint, and/or muscle pain. This medicine may also increase your risk for jaw problems or a broken thigh bone. Tell your doctor right away if you have severe pain in your jaw, bones, joints, or muscles. Tell your doctor if you have any pain that does not go away or that gets worse. Tell your dentist and dental surgeon that you are taking this medicine. You should not have major dental surgery while on this medicine. See your dentist to have a dental exam and fix any dental problems before starting this medicine. Take good care of your teeth while on this medicine. Make sure you see your dentist for regular follow-up appointments. What side effects may I notice from receiving this medicine? Side effects that you should report to your doctor or health care professional as soon as possible: -allergic reactions like skin rash, itching or hives, swelling of the face, lips, or tongue -anxiety, confusion, or depression -breathing problems -changes in vision -eye pain -feeling faint or lightheaded, falls -jaw pain, especially after dental work -mouth sores -muscle cramps, stiffness, or weakness -redness, blistering, peeling or loosening of the skin, including inside the mouth -trouble passing urine or change in the amount of urine Side effects that usually do not require medical attention (report to your doctor or health care professional if they continue or are bothersome): -bone, joint, or muscle pain -constipation -diarrhea -fever -hair loss -irritation at site where injected -loss of appetite -nausea, vomiting -stomach upset -trouble sleeping -trouble swallowing -weak or tired This list may not describe all possible side effects. Call your doctor for medical advice about side effects. You may report side effects to FDA at 1-800-FDA-1088. Where should I keep my medicine? This drug is given in a hospital or clinic and  will not be stored at home. NOTE: This sheet is a summary. It may not cover all possible information. If you have questions about this medicine, talk to your doctor, pharmacist, or health care provider.  2018 Elsevier/Gold Standard (2014-05-07 14:19:39)

## 2018-05-21 NOTE — Progress Notes (Signed)
Hematology and Oncology Follow Up Visit  Michael [name] [id number] 10/10/1946 72 y.o. 05/21/2018 1:55 PM Nafziger, Augustine Radar, Tommi Rumps, NP   Principle Diagnosis: 72 year old gentleman with stage IV renal cell carcinoma presented with left kidney mass and metastatic disease to the bone and the lung documented in May 2019.   Prior Therapy:  Status post bone biopsy on Apr 28, 2018 which confirmed the diagnosis of renal cell carcinoma.   Current therapy: Under consideration to start systemic therapy.  Interim History: Mr. Michael [name] presents today for a follow-up visit.  Since her last visit, he continues to have further decline in his overall health and deterioration and increase in his back pain.  He has been using Norco as needed which have helped his pain.  His appetite has been marginal and is unclear if he lost more weight.  He is still participating in physical activity part of his physical therapy and rehab.  Is moving his bowels regularly at this time.  He denies any weakness in his lower extremities.  He does not report any headaches, blurry vision, syncope or seizures.  He denies any alteration of mental status or confusion.  Does not report any fevers, chills or sweats.  Does not report any cough, wheezing or hemoptysis.  Does not report any chest pain, palpitation, orthopnea or leg edema.  Does not report any nausea, vomiting or abdominal pain.  Does not report any constipation or diarrhea.    Does not report frequency, urgency or hematuria.  Does not report any skin rashes or lesions. Does not report any heat or cold intolerance.  Does not report any lymphadenopathy or petechiae.  Does not report any anxiety or depression.  Remaining review of systems is negative.    Medications: I have reviewed the patient's current medications.  Current Outpatient Medications  Medication Sig Dispense Refill  . amLODipine (NORVASC) 10 MG tablet Take 1 tablet (10 mg total) by mouth daily. 30 tablet  0  . atorvastatin (LIPITOR) 20 MG tablet Take 1 tablet (20 mg total) by mouth daily. 90 tablet 3  . COMBIGAN 0.2-0.5 % ophthalmic solution INSTILL ONE GTT IN OU Q 12 H  5  . docusate sodium (COLACE) 100 MG capsule Take 2 capsules (200 mg total) by mouth daily as needed for mild constipation. 20 capsule 0  . furosemide (LASIX) 40 MG tablet Take 1 tablet (40 mg total) by mouth 2 (two) times daily for 10 days. 20 tablet 0  . HYDROcodone-acetaminophen (NORCO/VICODIN) 5-325 MG tablet Take 1-2 tablets by mouth every 6 (six) hours as needed for moderate pain or severe pain. 14 tablet 0  . polyethylene glycol (MIRALAX / GLYCOLAX) packet Take 17 g by mouth daily. 14 each 0  . tamsulosin (FLOMAX) 0.4 MG CAPS capsule TAKE 1 CAPSULE BY MOUTH EVERY DAY 90 capsule 0  . TRAVATAN Z 0.004 % SOLN ophthalmic solution INSTILL 1 DROP INTO BOTH EYES IN THE EVENING  3   No current facility-administered medications for this visit.      Allergies: No Known Allergies  Past Medical History, Surgical history, Social history, and Family History were reviewed and updated.   Physical Exam: ECOG: 3 General appearance: Chronically ill-appearing gentleman with mild distress today. Head: Atraumatic without abnormalities. Oropharynx: No oral thrush or ulcers. Eyes: No scleral icterus.  Pupils are equal and round reactive to light. Lymph nodes: Cervical, supraclavicular, and axillary nodes normal. Heart:regular rate and rhythm, S1, S2 normal, no murmur, click, rub or gallop Lung:chest clear, no  wheezing, rales, normal symmetric air entry Abdomin: soft, non-tender, without masses or organomegaly. Neurological: No deficits noted and he is able to move all extremities. Skin: No rashes or lesions.  No ecchymosis or petechiae. Musculoskeletal: No joint deformity or effusion. Psychiatric: Mood and affect are appropriate.    Lab Results: Lab Results  Component Value Date   WBC 7.7 05/01/2018   HGB 10.2 (L) 05/01/2018    HCT 30.7 (L) 05/01/2018   MCV 87.7 05/01/2018   PLT 203 05/01/2018     Chemistry      Component Value Date/Time   NA 142 05/01/2018 0434   K 3.5 05/01/2018 0434   CL 104 05/01/2018 0434   CO2 26 05/01/2018 0434   BUN 27 (H) 05/01/2018 0434   CREATININE 1.35 (H) 05/01/2018 0434      Component Value Date/Time   CALCIUM 7.6 (L) 05/01/2018 0434   CALCIUM 15.1 (HH) 04/23/2018 2357   ALKPHOS 340 (H) 04/30/2018 0426   AST 67 (H) 04/30/2018 0426   ALT 61 04/30/2018 0426   BILITOT 1.5 (H) 04/30/2018 0426     EXAM: NUCLEAR MEDICINE WHOLE BODY BONE SCAN  TECHNIQUE: Whole body anterior and posterior images were obtained approximately 3 hours after intravenous injection of radiopharmaceutical.  RADIOPHARMACEUTICALS:  21.2 mCi Technetium-58m MDP IV  COMPARISON:  None  Correlation: CT chest 05/15/2018  FINDINGS: Foci of abnormal increased tracer localization identified at scattered ribs suspicious for osseous metastases.  A focus of uptake at the lateral LEFT seventh rib corresponds to a healing fracture on CT.  Focus of uptake at the inferior LEFT glenoid corresponding to lytic lesion on CT.  Abnormal tracer localization at the proximal LEFT femoral diaphysis suspicious for metastasis.  Probable foci of increased tracer localization/metastasis in RIGHT iliac bone.  Uptake at the proximal tibia bilaterally.  Additional uptake at the shoulders and knees typically degenerative.  Expected urinary tract and soft tissue distribution of tracer.  IMPRESSION: Multiple sites of abnormal osseous tracer accumulation involving ribs, RIGHT iliac bone, LEFT femur, and questionably the proximal tibia, suspicious for osseous metastases.  Numerous additional lytic lesions seen on CT do not demonstrate increased tracer localization; bone scans may be falsely negative in patients with purely lytic metastases, such as from malignancies like renal cell  carcinoma.  EXAM: CT CHEST WITHOUT CONTRAST  TECHNIQUE: Multidetector CT imaging of the chest was performed following the standard protocol without IV contrast.  COMPARISON:  Partial comparison to CT abdomen/pelvis dated 04/24/2018  FINDINGS: Cardiovascular: Heart is normal in size.  No pericardial effusion.  No evidence of thoracic aortic aneurysm. Atherosclerotic calcifications of the aortic arch.  Three vessel coronary atherosclerosis.  Mediastinum/Nodes: Small mediastinal lymph nodes, including a 10 mm short axis right lower paratracheal node.  Visualized right thyroid is enlarged/nodular.  Lungs/Pleura: Innumerable nodules throughout the lungs bilaterally, with associated interlobular septal thickening in the bilateral upper lobes and left lower lobes.  While direct comparison to recent CT abdomen pelvis is difficult, mild progression in the lingula is favored, while mild decreased patchy opacity in the right lower lobe.  Given the clinical history, this appearance likely reflects diffuse pulmonary metastases, although technically speaking multifocal infection or sarcoidosis could have a similar appearance. Given some degree of waxing/waning, at least superimposed infection is suspected.  Small left pleural effusion.  No pneumothorax.  Upper Abdomen: Visualized upper abdomen is notable for mild hepatic steatosis and a layering 12 mm gallstone.  Musculoskeletal: Diffuse/multifocal osseous metastases throughout the visualized axial and appendicular skeleton,  including dominant lytic metastases with pathologic fracture at T2 (sagittal image 117) and T5 (sagittal image 114).  IMPRESSION: Innumerable pulmonary nodules throughout the lungs bilaterally, with some degree of superimposed waxing/waning opacity in the lingula and right lower lobe. Given the clinical history, this likely reflects diffuse pulmonary metastases with superimposed infection,  although technically speaking infection or inflammation such as sarcoidosis (not favored) could account for the overall appearance.  Small left pleural effusion.  Diffuse/multifocal osseous metastases, including pathologic fracture at T2 and T5.    Radiological Studies:   Impression and Plan:  72 year old man with:  1. Stage IV renal cell carcinoma arising from the left kidney with metastatic disease to the bone, lymphadenopathy and the lung.    He was diagnosed in May 2019 with a bone biopsy.  CT scan and bone scan obtained on May 15, 2018 were personally reviewed and discussed with the patient and his family.  He has clear metastatic disease and multiple areas including diffuse bony metastasis and pulmonary nodules.   The natural course of this disease was reviewed again with the patient and his family.  He has clear stage IV disease that is associated with poor risk features including debilitation and worsening performance status.  The ideal treatment would be combined immunotherapy utilizing nivolumab and ipilimumab.  Alternatively oral targeted therapy would be an option but considered potentially inferior in the setting.  After discussion today, he elected to move to [place] to be with his sister who is a Marine scientist and will help take care of him given his overall health deterioration.  I encouraged him to do so although his ability to make that trip is marginal.  He is willing to take this chance at this point and he understands he has a serious illness that would likely not able to receive any treatment for it in the future.    2.  Prognosis: Overall poor given his degree of disease burden as well as poor performance status.  He has limited life expectancy without treatment.  With treatment, this can be extended for a few months.  I anticipate life expectancy in the months rather than years.  3.  Hypercalcemia: His calcium will be repeated today and he will start Zometa to  control his hypercalcemia as well as prevent skeletal related events.  4.  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  5.  Back pain: He has been using hydrocodone which has been effective at times although he is requiring it more frequent.  He will be started on a fentanyl patch and use Norco for breakthrough.  6.  Follow-up: No follow-up will be scheduled at this time as he is planning to leave to [place] to live with his sister.  30 minutes was spent with the patient face-to-face today.  More than 50% of time was dedicated to patient counseling, education and Candiss Norse questions regarding diagnosis, prognosis as well as coordinating his care.     Zola Button, MD 5/30/20191:55 PM

## 2018-05-21 NOTE — Progress Notes (Signed)
PALLIATIVE CARE CONSULT VISIT   PATIENT NAME: Michael [name] DOB: 1946-11-05 MRN: [id number]  PRIMARY CARE PROVIDER:   Dorothyann Peng, NP  REFERRING PROVIDER:    Dr. Lysle Rubens  RESPONSIBLE PARTY:   Self  ASSESSMENT:     I met with patient and his sister. Patient is somewhat fatigued and slept intermittently throughout my conversation. Patient's sister is a Equities trader who lives in [place]. She tells me that patient lived here alone and was still working prior to his hospitalization. He has three children but all live out of state. She is hoping to extend his rehab stay another week while she finalizes plans to move him to [place] to live with her as his primary caregiver. She plans to establish PCP and oncology care in [place]. We discussed important patient records that would be helpful to facilitate this move. Note that patient has decided to be a DNR. I recommended that she travel with a DNR form.   Sister tells me that patient is leaning towards not pursuing anti-cancer treatment and says he is primarily concerned with quality of life over quantity. However, she seems hopeful that he may change his mind regarding treatment. We talked about the option of hospice care and she was receptive to this.    RECOMMENDATIONS and PLAN:  1. Continue rehab. Will speak with SW to help coordinate disposition. 2. Norco prn for pain 3. Oral supplements TID 4. Consider starting SSRI vs methylphenidate for fatigue  I spent 75 minutes providing this consultation,  from 1400 to 1515. More than 50% of the time in this consultation was spent coordinating communication.   HISTORY OF PRESENT ILLNESS:  Michael [name] is a 72 y.o. year old male with multiple medical problems including obesity, HTN, HLD, who was recently hospitalized for AMS and found to have hypercalcemia and ARF. Workup revealed a L. renal mass and extensive osseous metastasis involving the spine and ribs. He was found to have pathologic  fractures of L1 and bilateral ribs. He was also found to have probable pulmonary metastasis. Bone biopsy confirmed metastatic carcinoma with renal etiology. He has been evaluated by oncology and is considering treatment options. Palliative Care was asked to help address goals of care.   CODE STATUS: DNR  PPS: 30% HOSPICE ELIGIBILITY/DIAGNOSIS: TBD  PAST MEDICAL HISTORY:  Past Medical History:  Diagnosis Date  . Glaucoma   . High blood pressure   . Hyperlipidemia   . Morbid obesity ([place])     SOCIAL HX:  Social History   Tobacco Use  . Smoking status: Never Smoker  . Smokeless tobacco: Never Used  Substance Use Topics  . Alcohol use: Yes    Alcohol/week: 0.0 oz    ALLERGIES: No Known Allergies   PERTINENT MEDICATIONS:  Outpatient Encounter Medications as of 05/20/2018  Medication Sig  . amLODipine (NORVASC) 10 MG tablet Take 1 tablet (10 mg total) by mouth daily.  Marland Kitchen atorvastatin (LIPITOR) 20 MG tablet Take 1 tablet (20 mg total) by mouth daily.  . COMBIGAN 0.2-0.5 % ophthalmic solution INSTILL ONE GTT IN OU Q 12 H  . docusate sodium (COLACE) 100 MG capsule Take 2 capsules (200 mg total) by mouth daily as needed for mild constipation.  Marland Kitchen HYDROcodone-acetaminophen (NORCO/VICODIN) 5-325 MG tablet Take 1-2 tablets by mouth every 6 (six) hours as needed for moderate pain or severe pain.  . polyethylene glycol (MIRALAX / GLYCOLAX) packet Take 17 g by mouth daily.  . tamsulosin (FLOMAX) 0.4 MG CAPS  capsule TAKE 1 CAPSULE BY MOUTH EVERY DAY  . TRAVATAN Z 0.004 % SOLN ophthalmic solution INSTILL 1 DROP INTO BOTH EYES IN THE EVENING   No facility-administered encounter medications on file as of 05/20/2018.     PHYSICAL EXAM:   General: NAD, frail appearing, obese, lying in bed Cardiovascular: regular rate and rhythm Pulmonary: clear ant fields Abdomen: soft, nontender, + bowel sounds GU: no suprapubic tenderness Extremities: no edema, no joint deformities Skin: no  rashes Neurological: Weakness, speech clear, wakes when stimulated  Irean Hong, NP

## 2018-06-01 ENCOUNTER — Non-Acute Institutional Stay: Payer: Self-pay | Admitting: Hospice and Palliative Medicine

## 2018-06-01 DIAGNOSIS — Z515 Encounter for palliative care: Secondary | ICD-10-CM

## 2018-06-02 NOTE — Progress Notes (Signed)
Follow up visit attempted at SNF but patient had been discharged from the facility.

## 2018-06-04 ENCOUNTER — Telehealth: Payer: Self-pay | Admitting: Adult Health

## 2018-06-04 NOTE — Telephone Encounter (Unsigned)
Copied from [place] [id number]. Topic: General - Deceased Patient >> 2018-06-26  1:35 PM Michael [name] wrote: Pt son called to let Michael [name] that pt passed away on 2018-06-19   Route to department's PEC Pool.

## 2018-06-22 DEATH — deceased

## 2019-10-08 IMAGING — CT CT BIOPSY
1 of 2 series · 14 of 32 positions shown, 18 images · non-contrast
Comparison: none

INDICATION: 71-year-old male with a history of metastatic bone lesions. He has
been referred for a targeted bone lesion biopsy

[Series 2: i-spiral 5.0 b31f · axial · 0.79mm/px · z∈[-128,-48]mm · 14 of 27 slices shown, 18 images]
[im 2/27  soft-tissue]
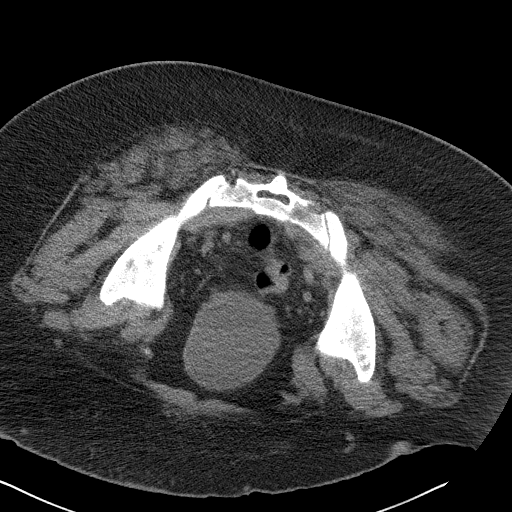
[im 2/27  bone]
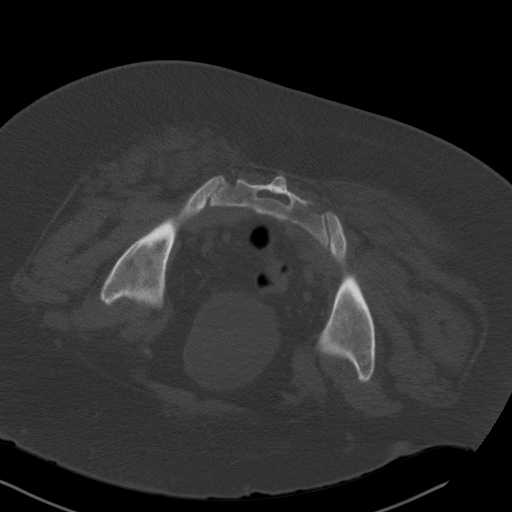
[im 4/27  soft-tissue]
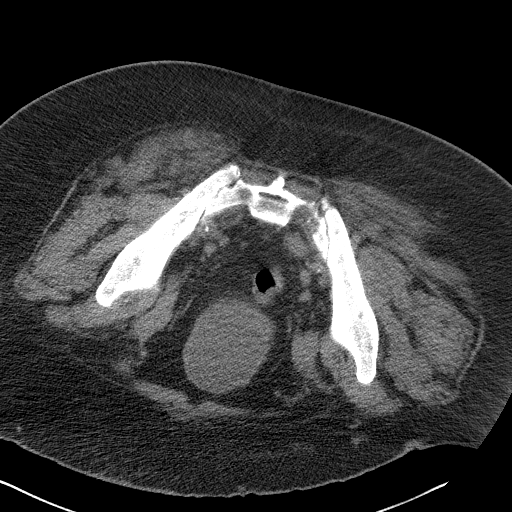
[im 6/27  soft-tissue]
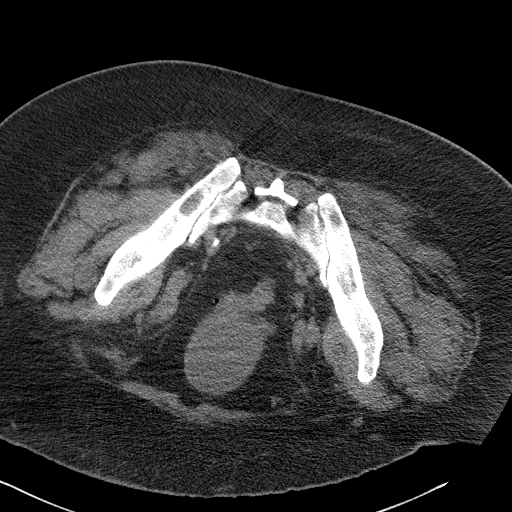
[im 8/27  soft-tissue]
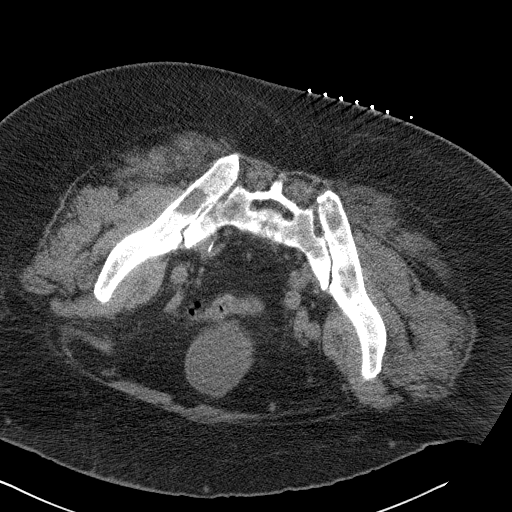
[im 11/27  soft-tissue]
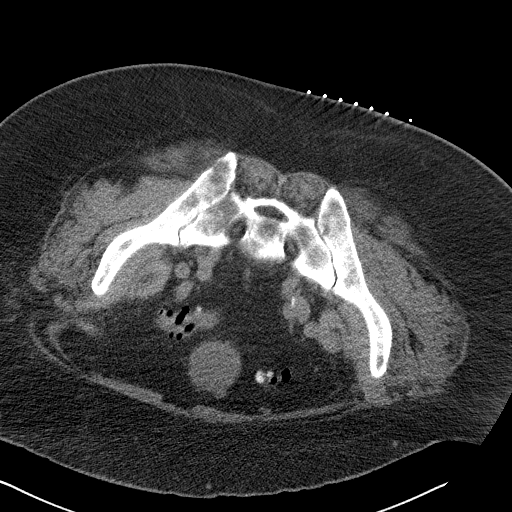
[im 13/27  soft-tissue]
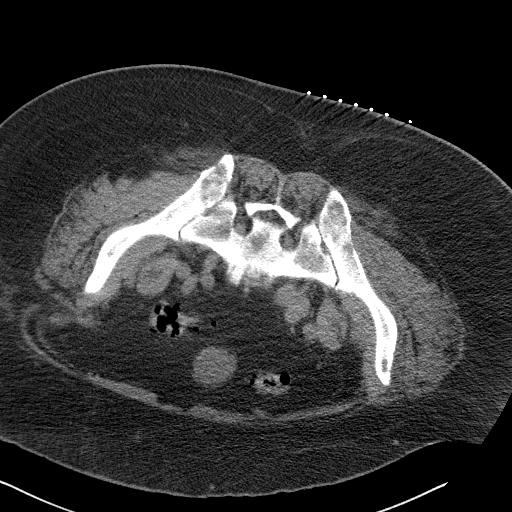
[im 14/27  soft-tissue]
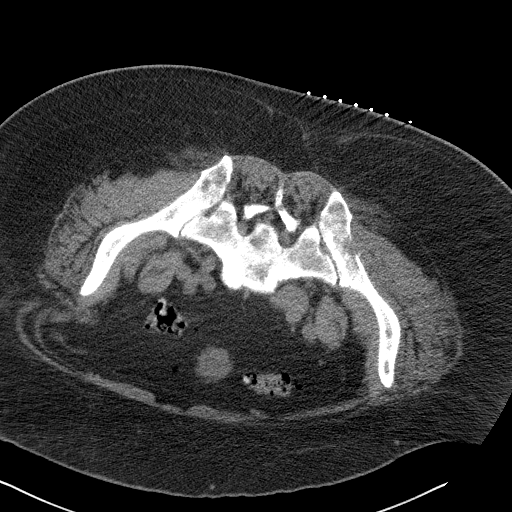
[im 16/27  soft-tissue]
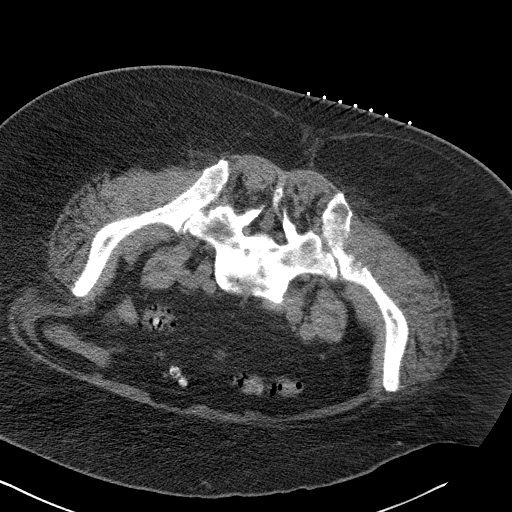
[im 19/27  soft-tissue]
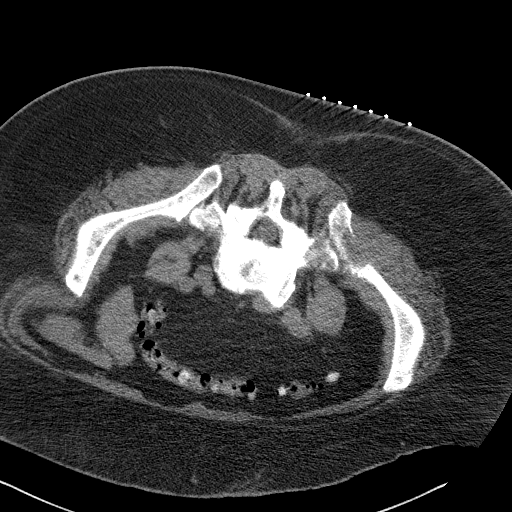
[im 19/27  bone]
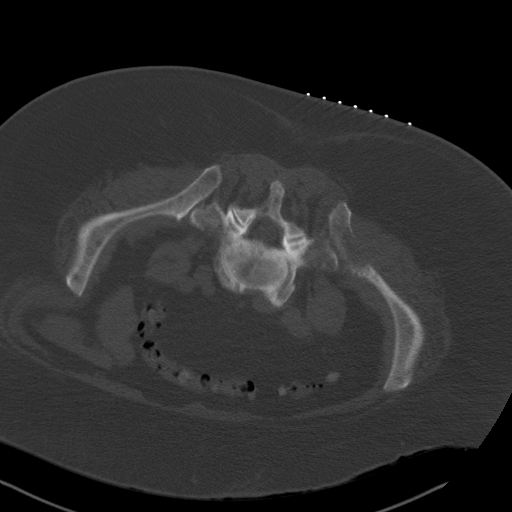
[im 21/27  soft-tissue]
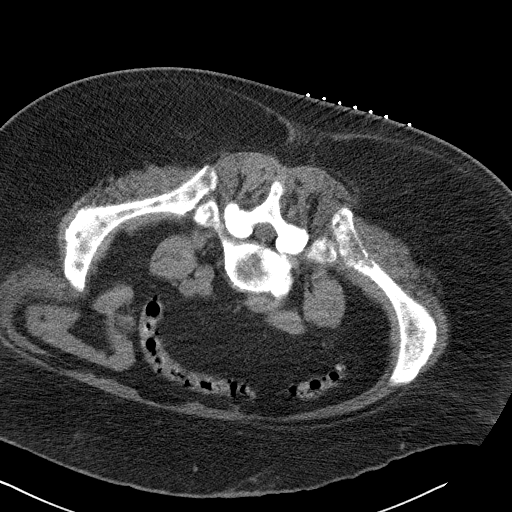
[im 22/27  lung]
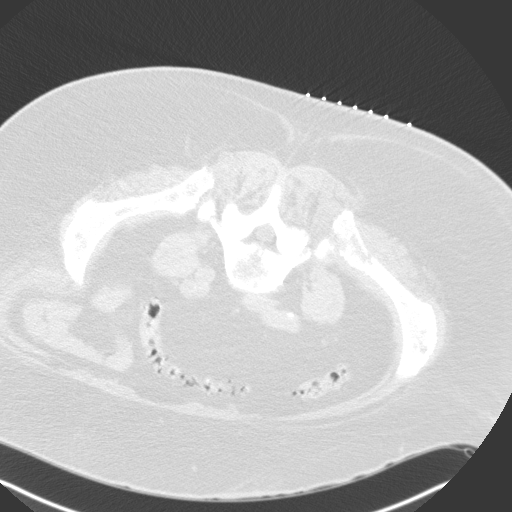
[im 23/27  soft-tissue]
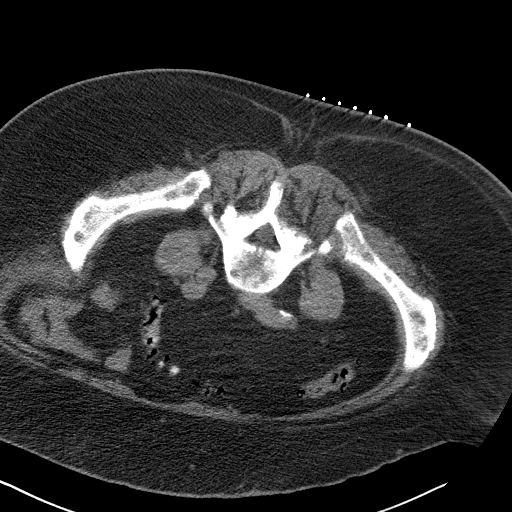
[im 23/27  lung]
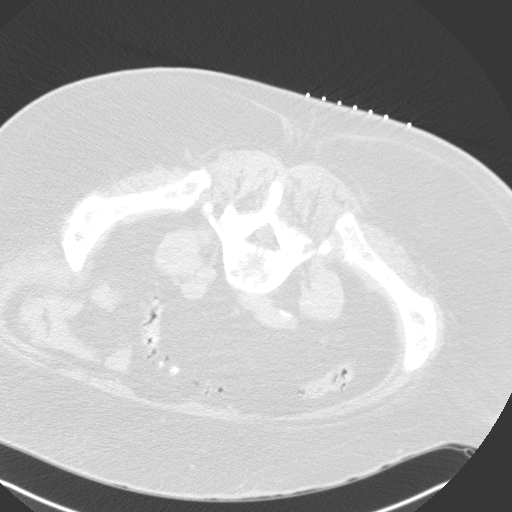
[im 24/27  lung]
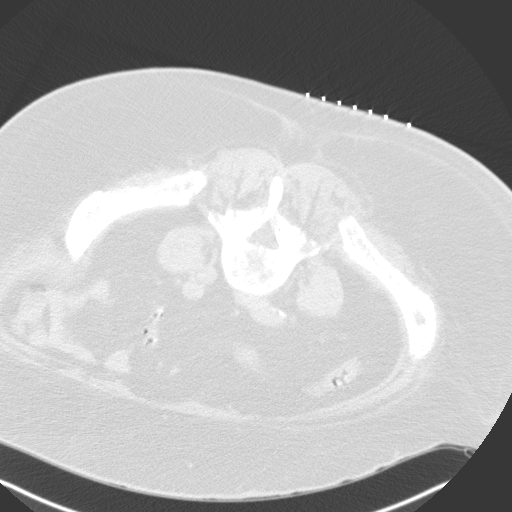
[im 25/27  soft-tissue]
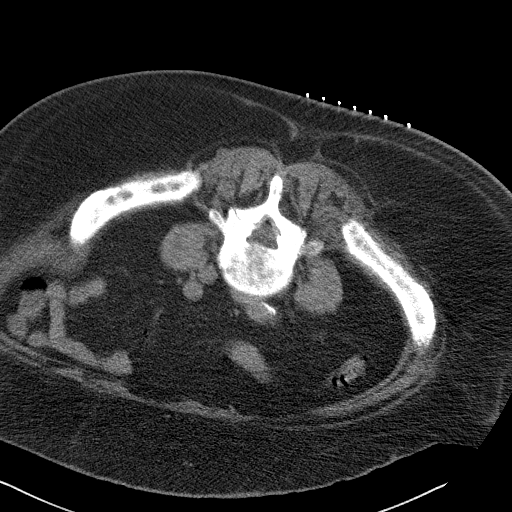
[im 25/27  lung]
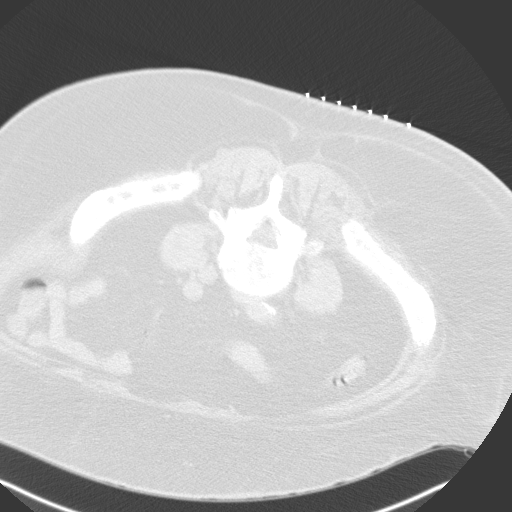

[14 of 32 positions shown; findings below may reference images not displayed]

EXAM:
CT BIOPSY

MEDICATIONS:
None.

ANESTHESIA/SEDATION:
Moderate (conscious) sedation was employed during this procedure. A
total of Versed 1.5 mg and Fentanyl 75 mcg was administered
intravenously.

Moderate Sedation Time: 14 minutes. The patient's level of
consciousness and vital signs were monitored continuously by
radiology nursing throughout the procedure under my direct
supervision.

FLUOROSCOPY TIME:  CT

COMPLICATIONS:
None

PROCEDURE:
The procedure risks, benefits, and alternatives were explained to
the patient. Questions regarding the procedure were encouraged and
answered. The patient understands and consents to the procedure.

Scout CT of the pelvis was performed for surgical planning purposes.

The posterior pelvis was prepped with chlorhexidinein a sterile
fashion, and a sterile drape was applied covering the operative
field. A sterile gown and sterile gloves were used for the
procedure. Local anesthesia was provided with 1% Lidocaine.

We targeted the lesion in the posterior left iliac bone for biopsy.
The skin and subcutaneous tissues were infiltrated with 1% lidocaine
without epinephrine. A small stab incision was made with an 11 blade
scalpel, and an 11 gauge Hebib needle was advanced with CT guidance
to the posterior cortex.

Manual forced was used to advance the needle through the posterior
cortex and the stylet was removed.

Once the needle was confirmed just proximal to the soft tissue
lesion of the left iliac bone. A coaxial 17 gauge needle was
advanced. Multiple 18 gauge core biopsy were then achieved and
placed into formalin.

The coaxial needle was then removed, and 11 gauge core biopsy was
performed of the soft tissue lesion/lytic lesion with the Hebib
needle.

Manual pressure was used for hemostasis and a sterile dressing was
placed.

No complications were encountered no significant blood loss was
encountered.

Patient tolerated the procedure well and remained hemodynamically
stable throughout.
IMPRESSION: Status post targeted bone lesion biopsy of the left posterior iliac
bone.

## 2019-10-11 IMAGING — DX DG CHEST 2V
2 series · 3 of 3 positions shown · non-contrast
Comparison: 04/29/2018 and 04/28/2018.

CLINICAL DATA: Increased shortness of breath. Mid chest pain since
this morning.

EXAM:
CHEST - 2 VIEW

[chest lat]
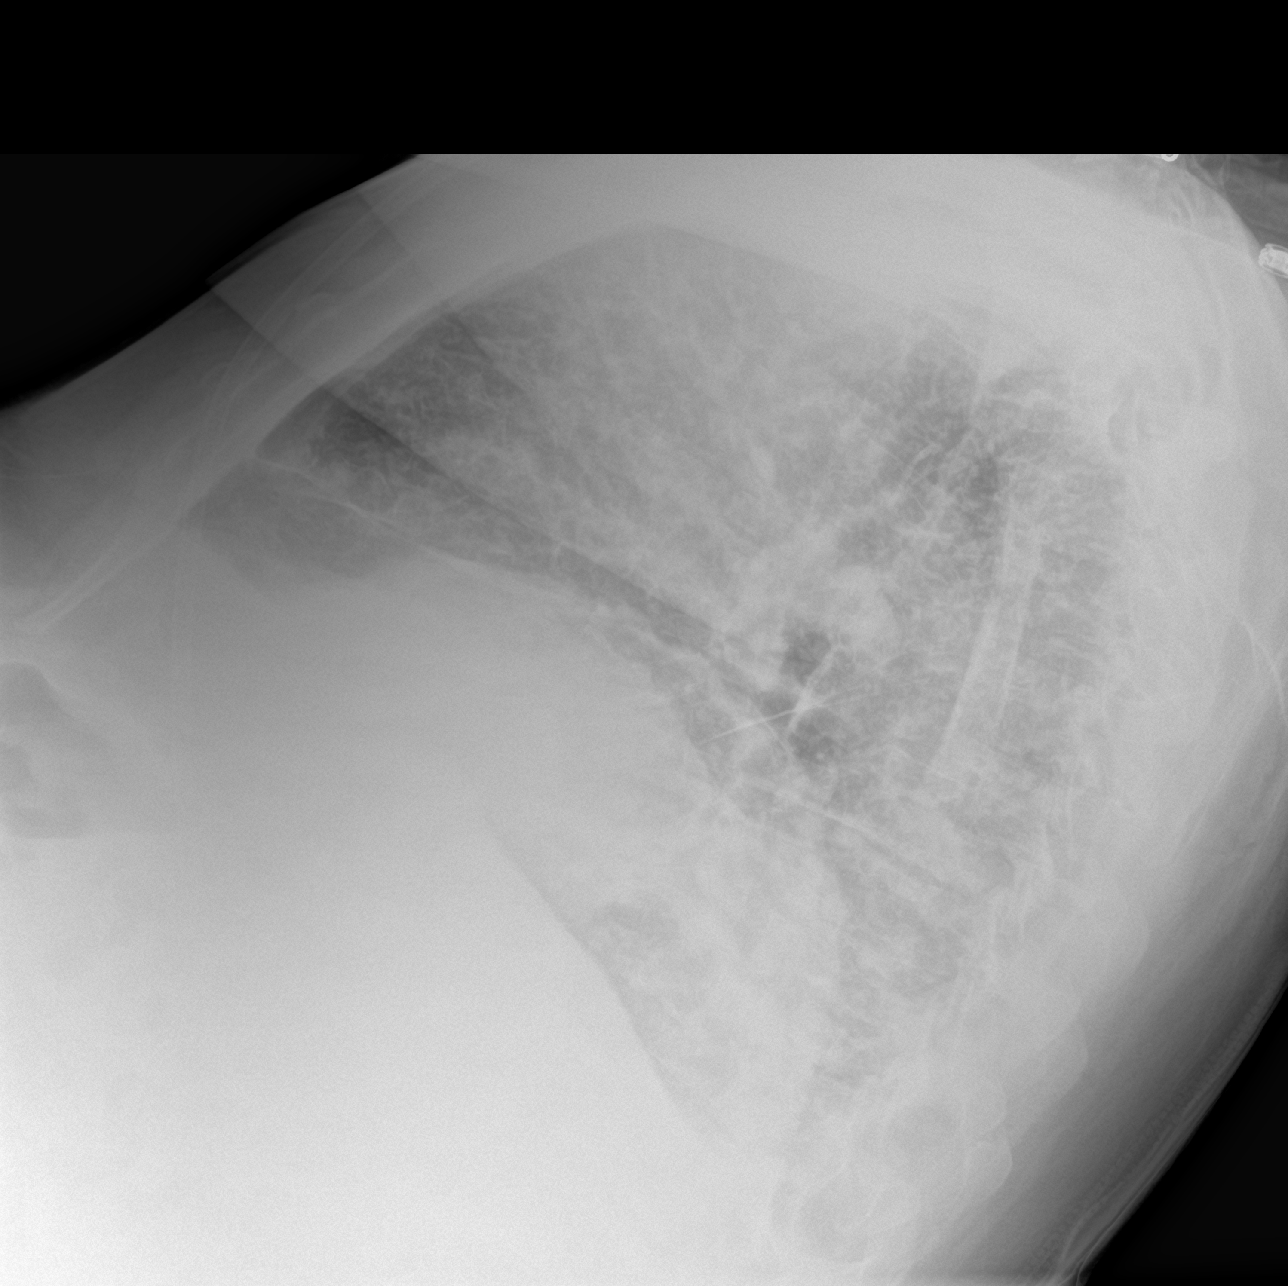

[Series 3: chest ap · 0.14mm/px · 2 of 2 slices shown]
[im 1/2]
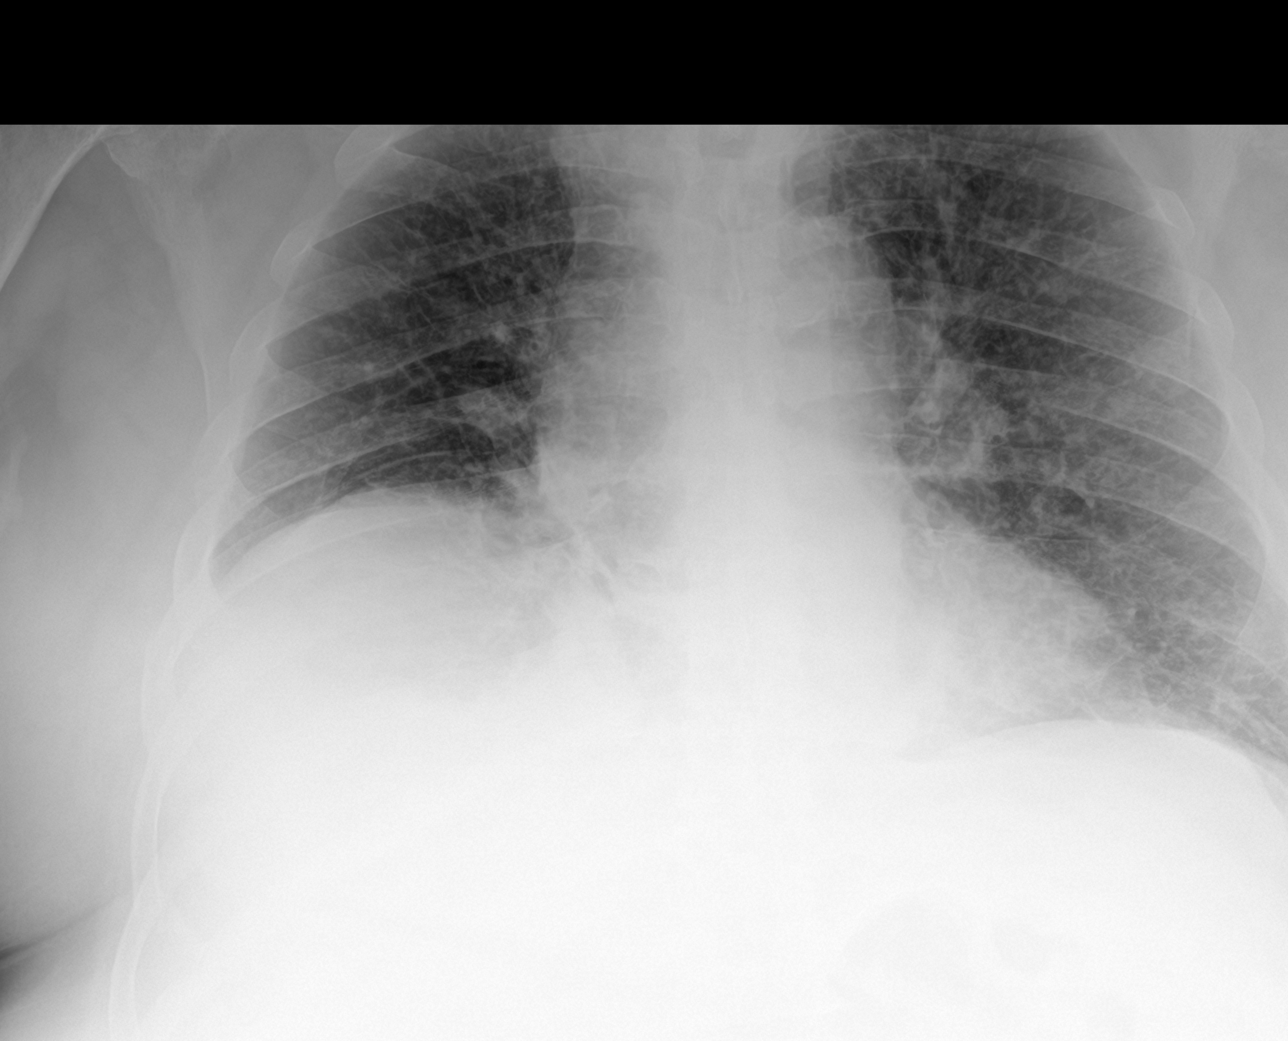
[im 2/2]
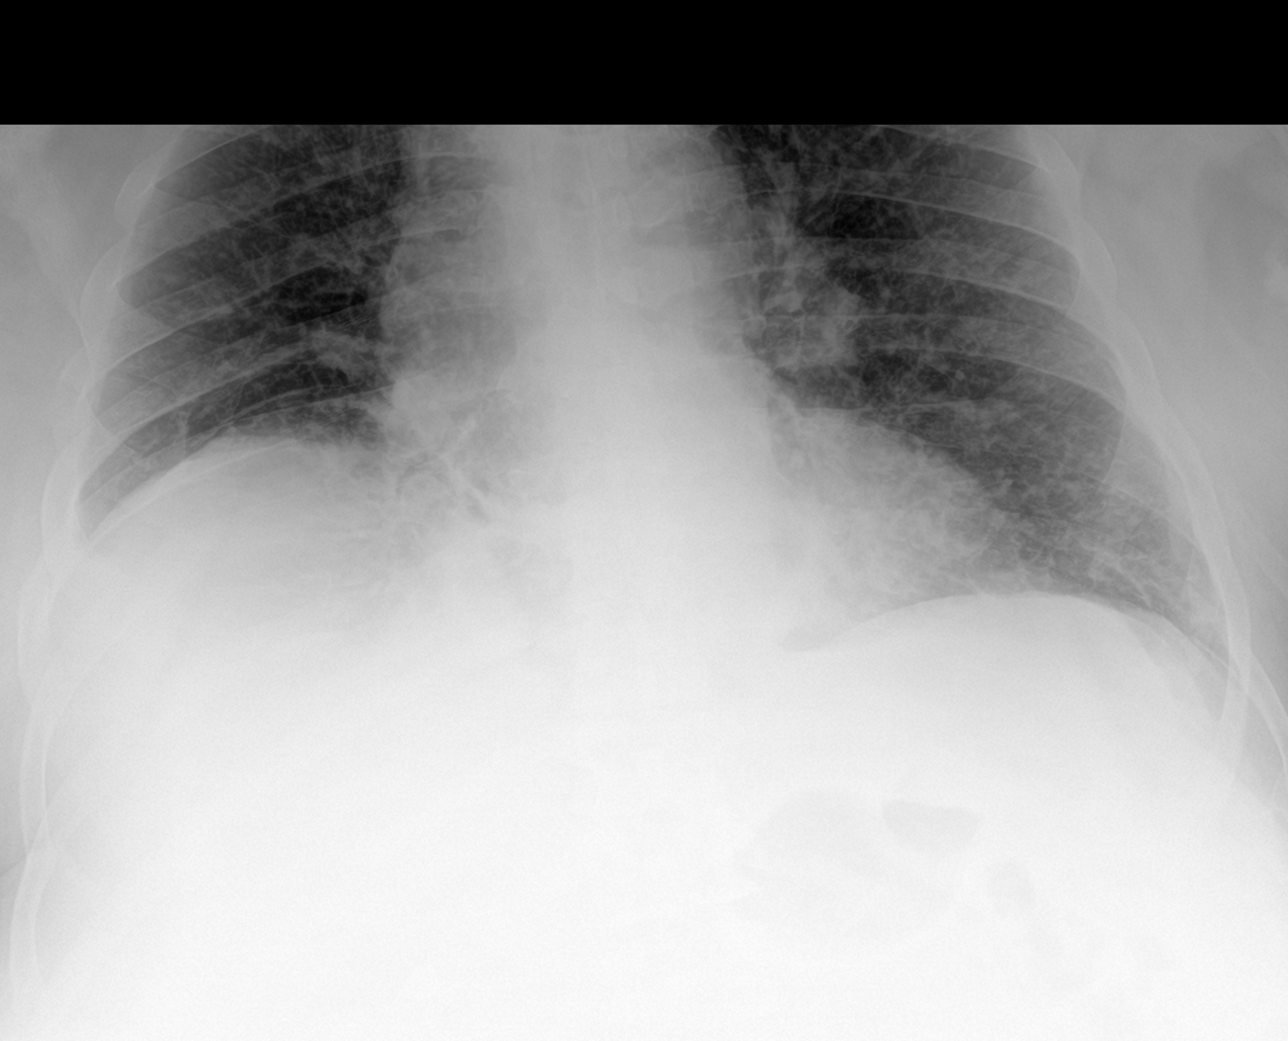

[3 of 3 positions shown; findings below may reference images not displayed]

FINDINGS: Stable enlarged cardiac silhouette and prominence of the
interstitial markings. Decreased prominence of the pulmonary
vasculature. Improved inspiration. Small right pleural effusion.
Hiatal hernia. Unremarkable bones.
IMPRESSION: 1. Improved pulmonary vascular congestion.
2. Stable cardiomegaly, chronic interstitial lung disease and
probable interstitial pulmonary edema.
3. Small right pleural effusion.
4. Small to moderate-sized hiatal hernia.
